# Patient Record
Sex: Female | Born: 1995 | Race: White | Hispanic: No | Marital: Married | Smoking: Never smoker
Health system: Southern US, Community
[De-identification: ages and names within clinical notes are randomized; demographics above are authoritative.]

## PROBLEM LIST (undated history)

## (undated) DIAGNOSIS — Z87440 Personal history of urinary (tract) infections: Secondary | ICD-10-CM

## (undated) DIAGNOSIS — R112 Nausea with vomiting, unspecified: Secondary | ICD-10-CM

## (undated) DIAGNOSIS — F988 Other specified behavioral and emotional disorders with onset usually occurring in childhood and adolescence: Secondary | ICD-10-CM

## (undated) DIAGNOSIS — Z9889 Other specified postprocedural states: Secondary | ICD-10-CM

## (undated) DIAGNOSIS — E785 Hyperlipidemia, unspecified: Secondary | ICD-10-CM

## (undated) DIAGNOSIS — F913 Oppositional defiant disorder: Secondary | ICD-10-CM

## (undated) DIAGNOSIS — F329 Major depressive disorder, single episode, unspecified: Secondary | ICD-10-CM

## (undated) DIAGNOSIS — F32A Depression, unspecified: Secondary | ICD-10-CM

## (undated) DIAGNOSIS — Z8489 Family history of other specified conditions: Secondary | ICD-10-CM

## (undated) DIAGNOSIS — N189 Chronic kidney disease, unspecified: Secondary | ICD-10-CM

## (undated) DIAGNOSIS — R059 Cough, unspecified: Secondary | ICD-10-CM

## (undated) DIAGNOSIS — Z7289 Other problems related to lifestyle: Secondary | ICD-10-CM

## (undated) DIAGNOSIS — T4145XA Adverse effect of unspecified anesthetic, initial encounter: Secondary | ICD-10-CM

## (undated) DIAGNOSIS — R05 Cough: Secondary | ICD-10-CM

## (undated) DIAGNOSIS — T8859XA Other complications of anesthesia, initial encounter: Secondary | ICD-10-CM

## (undated) HISTORY — DX: Depression, unspecified: F32.A

## (undated) HISTORY — DX: Chronic kidney disease, unspecified: N18.9

## (undated) HISTORY — DX: Oppositional defiant disorder: F91.3

## (undated) HISTORY — PX: WISDOM TOOTH EXTRACTION: SHX21

## (undated) HISTORY — DX: Major depressive disorder, single episode, unspecified: F32.9

## (undated) HISTORY — DX: Personal history of urinary (tract) infections: Z87.440

## (undated) HISTORY — DX: Other specified behavioral and emotional disorders with onset usually occurring in childhood and adolescence: F98.8

## (undated) HISTORY — DX: Hyperlipidemia, unspecified: E78.5

---
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
## 2017-01-11 ENCOUNTER — Ambulatory Visit: Payer: 59 | Admitting: Primary Care

## 2017-01-14 ENCOUNTER — Encounter: Payer: Self-pay | Admitting: Primary Care

## 2017-01-14 ENCOUNTER — Ambulatory Visit (INDEPENDENT_AMBULATORY_CARE_PROVIDER_SITE_OTHER): Payer: 59 | Admitting: Primary Care

## 2017-01-14 VITALS — BP 112/70 | HR 60 | Temp 98.1°F | Ht 63.0 in | Wt 129.8 lb

## 2017-01-14 DIAGNOSIS — R1905 Periumbilic swelling, mass or lump: Secondary | ICD-10-CM | POA: Diagnosis not present

## 2017-01-14 NOTE — Progress Notes (Signed)
   Subjective:    Patient ID: Taylor CrankerKaylee Kress, female    DOB: [birth year], 21 y.o.   MRN: [id number]  HPI  Ms. Frutoso SchatzKress is a 21 year old female who presents today with a chief complaint abdominal mass.  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 Over the past 5 years she's noticed growth of these two masses and over the past 2 years she's noticed discomfort. The masses are located proximal to her naval. Her pain feels "sharp" that is intermittently lasting minutes to one hour. She denies changes skin texture/color, trauma.  Review of Systems  Constitutional: Negative for fever.  Gastrointestinal: Negative for abdominal pain, constipation, diarrhea and nausea.  Skin: Negative for color change.       Abdominal skin masses       Past Medical History:  Diagnosis Date  . ADD (attention deficit disorder)   . Depression   . History of UTI   . Oppositional defiant disorder   . Self mutilating behavior      Social History   Social History  . Marital status: Single    Spouse name: N/A  . Number of children: N/A  . Years of education: N/A   Occupational History  . Student     8th grade [employer]   Social History Main Topics  . Smoking status: Never Smoker  . Smokeless tobacco: Never Used  . Alcohol use No  . Drug use: No     Comment: THC every other month  . Sexual activity: No   Other Topics Concern  . 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   Enjoys welding, theater, exercising.    Past Surgical History:  Procedure Laterality Date  . KIDNEY SURGERY  2004   Reflux  . WISDOM TOOTH EXTRACTION      Family History  Problem Relation Age of Onset  . Adopted: Yes    No Known Allergies  No current outpatient prescriptions on file prior to visit.   No current facility-administered medications on file prior to visit.     BP 112/70   Pulse 60   Temp 98.1 F (36.7 C) (Oral)    Ht 5\' 3"  (1.6 m)   Wt 129 lb 12.8 oz (58.9 kg)   LMP 01/14/2017   SpO2 99%   BMI 22.99 kg/m    Objective:   Physical Exam  Constitutional: She appears well-nourished.  Cardiovascular: Normal rate.   Pulmonary/Chest: Effort normal.  Abdominal:    0.5 cm circular, non tender, soft superficial-deep mass to top of naval. Smaller circular, non tender, soft, deeper mass proximal to naval. No erythema.   Skin: Skin is warm and dry.          Assessment & Plan:  Skin Mass:  Two masses noted. Exam today consistent for benign mass such as lipoma/cyst. Given increase growth coupled with discomfort, will order ultrasound for further evaluation. Low suspicion for hernia. No GI symptoms. Reassurance provided today.  Morrie Sheldonlark,Dakin Madani Kendal, NP

## 2017-01-14 NOTE — Patient Instructions (Signed)
Stop by the front desk and speak with either Shirlee LimerickMarion or Zella BallRobin regarding your ultrasound. I will be in touch with you once I receive the results.  It was a pleasure to see you today!

## 2017-01-18 ENCOUNTER — Ambulatory Visit
Admission: RE | Admit: 2017-01-18 | Discharge: 2017-01-18 | Disposition: A | Discharge status: Home / Self Care | Payer: 59 | Source: Ambulatory Visit | Attending: Primary Care | Admitting: Primary Care

## 2017-01-18 ENCOUNTER — Telehealth: Payer: Self-pay

## 2017-01-18 DIAGNOSIS — R1905 Periumbilic swelling, mass or lump: Secondary | ICD-10-CM | POA: Insufficient documentation

## 2017-01-18 DIAGNOSIS — K439 Ventral hernia without obstruction or gangrene: Secondary | ICD-10-CM

## 2017-01-18 NOTE — Telephone Encounter (Signed)
Pt was asking if there was a time frame that the hernias would have to be repaired. I explained that it was up to her when she wanted to do anything about them. I suggested she let us know when she needed a referral. The surgeon wold be able to tell her how long the recovery would be.

## 2017-02-18 NOTE — Telephone Encounter (Signed)
tvm

## 2017-02-18 NOTE — Addendum Note (Signed)
Addended by: Doreene Nest on: 02/18/2017 05:46 PM   Modules accepted: Orders

## 2017-02-18 NOTE — Telephone Encounter (Signed)
Pt left v/m requesting a contact # for surgeon in [place]. Pt request cb. Does pt need referral for surgeon.

## 2017-02-18 NOTE — Telephone Encounter (Signed)
Please notify patient that I placed a referral for a general surgical consult. Someone will be in touch with her soon to discuss location.

## 2017-02-19 NOTE — Telephone Encounter (Signed)
Spoken and notified patient of Kate's comments. Patient verbalized understanding. 

## 2017-02-20 ENCOUNTER — Encounter: Payer: Self-pay | Admitting: *Deleted

## 2017-02-20 ENCOUNTER — Telehealth: Payer: Self-pay | Admitting: Primary Care

## 2017-02-20 NOTE — Telephone Encounter (Signed)
Left message asking pt to call office referring to gen surgery referral

## 2017-03-04 ENCOUNTER — Ambulatory Visit: Payer: Self-pay | Admitting: General Surgery

## 2017-03-11 ENCOUNTER — Encounter: Payer: Self-pay | Admitting: *Deleted

## 2017-03-14 ENCOUNTER — Encounter: Payer: Self-pay | Admitting: General Surgery

## 2017-03-14 ENCOUNTER — Ambulatory Visit (INDEPENDENT_AMBULATORY_CARE_PROVIDER_SITE_OTHER): Payer: 59 | Admitting: General Surgery

## 2017-03-14 VITALS — BP 128/78 | HR 78 | Resp 12 | Ht 63.0 in | Wt 127.0 lb

## 2017-03-14 DIAGNOSIS — K439 Ventral hernia without obstruction or gangrene: Secondary | ICD-10-CM

## 2017-03-14 NOTE — Patient Instructions (Addendum)
Follow up appointment to be announced.  

## 2017-03-14 NOTE — Progress Notes (Signed)
Patient ID: Taylor Miranda, female   DOB: 1996-04-02, 21 y.o.   MRN: [id number]  Chief Complaint  Patient presents with  . Other    HPI Taylor Miranda is a 21 y.o. female here today for a evaluation of ventral hernia. Patient states she noticed this area above the umbilicus about three years ago. Patient states she has pain with activity. The area has got bigger in the last two years. Mom states she was born with an hernia, but this never significantly changed in size. The patient participates in mixed martial arts.  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  Mom, Taylor Miranda is present.   HPI  Past Medical History:  Diagnosis Date  . ADD (attention deficit disorder)   . Depression   . History of UTI   . Oppositional defiant disorder   . Self mutilating behavior     Past Surgical History:  Procedure Laterality Date  . KIDNEY SURGERY  2004   Reflux  . WISDOM TOOTH EXTRACTION      Family History  Problem Relation Age of Onset  . Adopted: Yes    Social History Social History  Substance Use Topics  . Smoking status: Never Smoker  . Smokeless tobacco: Never Used  . Alcohol use No    No Known Allergies  Current Outpatient Prescriptions  Medication Sig Dispense Refill  . amoxicillin (AMOXIL) 500 MG capsule TAKE 1 BY MOUTH TWICE A DAY WITH MEALS  0  . XULANE 150-35 MCG/24HR transdermal patch PLACE 1 PATCH ONTO THE SKIN ONCE A WEEK.  3   No current facility-administered medications for this visit.     Review of Systems Review of Systems  Constitutional: Negative.   Respiratory: Negative.   Cardiovascular: Negative.     Blood pressure 128/78, pulse 78, resp. rate 12, height  (1.6 m), weight 127 lb (57.6 kg), last menstrual period 03/06/2017.  Physical Exam Physical Exam  Constitutional: She is oriented to person, place, and time. She appears well-developed and well-nourished.  Eyes: Conjunctivae are normal. No  scleral icterus.  Cardiovascular: Normal rate, regular rhythm and normal heart sounds.   Pulmonary/Chest: Effort normal and breath sounds normal.  Abdominal: Soft. Normal appearance and bowel sounds are normal. There is no tenderness.    Epigastric  hernia 2 cm above the umbilicus and the other one is 7 cm above the umbilicus.   Lymphadenopathy:    She has no cervical adenopathy.  Neurological: She is alert and oriented to person, place, and time.  Skin: Skin is warm and dry.      Data Reviewed Abdominal ultrasound dated 01/18/2017: IMPRESSION: Imaging features most suggestive of midline ventral hernias containing herniated fat. Review of the previous CT scan from 2011 reveals the presence of an apparent small fat containing ventral hernia about 7 cm cranial to the umbilicus. Although no definite bowel is seen within either lesion on today's study, if surgical repair is planned, repeat CT may prove helpful to ensure that there is no bowel within either apparent hernia.  Assessment    Epigastric hernias 2.    Plan    Due to her symptoms, elective repair is reasonable. With her thin body habitus and the likelihood that this is pre-peritoneal fat without a intraperitoneal component, I think direct repair rather than laparoscopic approach would be appropriate. She is aware that she'll have likely 2 small scars.  We need to work around her work schedule, and  at present plans are for her to have her repair completed prior just as she begins are normal one-week off and then take an additional week to recuperate.  The patient reports that her coworkers to allow her to lift more than 40 pounds.     Hernia precautions and incarceration were discussed with the patient. If they develop symptoms of an incarcerated hernia, they were encouraged to seek prompt medical attention.  I don't anticipate any role for prosthetic mesh based on today's exam or review of her ultrasound.   [name] 03/15/2017, 6:50 AM  Patient wishes to have surgery done in January 2019. She will be contacted once the January schedule is available to arrange a date. Day of surgery instructions were provided to the patient and reviewed today.   Nicholes Mango, CMA

## 2017-03-15 DIAGNOSIS — K439 Ventral hernia without obstruction or gangrene: Secondary | ICD-10-CM | POA: Insufficient documentation

## 2017-03-20 ENCOUNTER — Telehealth: Payer: Self-pay | Admitting: *Deleted

## 2017-03-20 NOTE — Telephone Encounter (Signed)
Spoke with the patient today and she was notified that we have the January 2019 schedule available and can arrange for epigastric hernia repair.   Patient wishes to check her schedule and call the office back to arrange.

## 2017-03-25 ENCOUNTER — Telehealth: Payer: Self-pay | Admitting: *Deleted

## 2017-03-25 NOTE — Telephone Encounter (Signed)
Patient's surgery has been [facility].

## 2017-04-30 ENCOUNTER — Telehealth: Payer: Self-pay

## 2017-04-30 NOTE — Telephone Encounter (Signed)
Patient called and would like to reschedule her surgery to a sooner date. The patient is [facility]. She will pre admit by phone. The patient is aware of date and instructions.  OR has been notified of change.

## 2017-05-21 ENCOUNTER — Other Ambulatory Visit: Payer: Self-pay | Admitting: General Surgery

## 2017-05-23 ENCOUNTER — Other Ambulatory Visit: Payer: Self-pay

## 2017-05-23 ENCOUNTER — Encounter
Admission: RE | Admit: 2017-05-23 | Discharge: 2017-05-23 | Disposition: A | Discharge status: Home / Self Care | Payer: 59 | Source: Ambulatory Visit | Attending: General Surgery | Admitting: General Surgery

## 2017-05-23 HISTORY — DX: Other specified postprocedural states: R11.2

## 2017-05-23 HISTORY — DX: Adverse effect of unspecified anesthetic, initial encounter: T41.45XA

## 2017-05-23 HISTORY — DX: Cough, unspecified: R05.9

## 2017-05-23 HISTORY — DX: Other complications of anesthesia, initial encounter: T88.59XA

## 2017-05-23 HISTORY — DX: Other specified postprocedural states: Z98.890

## 2017-05-23 HISTORY — DX: Cough: R05

## 2017-05-23 NOTE — Patient Instructions (Signed)
  Your procedure is scheduled on: 05-30-17 Report to Same Day Surgery 2nd floor medical mall [facility](Medical Mall Entrance-take elevator on left to 2nd floor.  Check in with surgery information desk.) To find out your arrival time please call [phone] between 1PM - 3PM on 05-29-17  Remember: Instructions that are not followed completely may result in serious medical risk, up to and including death, or upon the discretion of your surgeon and anesthesiologist your surgery may need to be rescheduled.    _x___ 1. Do not eat food after midnight the night before your procedure. You may drink clear liquids up to 2 hours before you are scheduled to arrive at the hospital for your procedure.  Do not drink clear liquids within 2 hours of your scheduled arrival to the hospital.  Clear liquids include  --Water or Apple juice without pulp  --Clear carbohydrate beverage such as ClearFast or Gatorade  --Black Coffee or Clear Tea (No milk, no creamers, do not add anything to the coffee or Tea   No gum chewing or hard candies.     __x__ 2. No Alcohol for 24 hours before or after surgery.   __x__3. No Smoking for 24 prior to surgery.   ____  4. Bring all medications with you on the day of surgery if instructed.    __x__ 5. Notify your doctor if there is any change in your medical condition     (cold, fever, infections).     Do not wear jewelry, make-up, hairpins, clips or nail polish.  Do not wear lotions, powders, or perfumes. You may wear deodorant.  Do not shave 48 hours prior to surgery. Men may shave face and neck.  Do not bring valuables to the hospital.    [facility] any belongings or valuables.               Contacts, dentures or bridgework may not be worn into surgery.  Leave your suitcase in the car. After surgery it may be brought to your room.  For patients admitted to the hospital, discharge time is determined by your  treatment team.   Patients discharged the day  of surgery will not be allowed to drive home.  You will need someone to drive you home and stay with you the night of your procedure.  ____ Take anti-hypertensive listed below, cardiac, seizure, asthma, anti-reflux and psychiatric medicines. These include:  1. NONE  2.  3.  4.  5.  6.  ____Fleets enema or Magnesium Citrate as directed.   ____ Use CHG Soap or sage wipes as directed on instruction sheet   ____ Use inhalers on the day of surgery and bring to hospital day of surgery  ____ Stop Metformin and Janumet 2 days prior to surgery.    ____ Take 1/2 of usual insulin dose the night before surgery and none on the morning surgery.   ____ Follow recommendations from Cardiologist, Pulmonologist or PCP regarding stopping Aspirin, Coumadin, Plavix ,Eliquis, Effient, or Pradaxa, and Pletal.  ____Stop Anti-inflammatories such as Advil, Aleve, Ibuprofen, Motrin, Naproxen, Naprosyn, Goodies powders or aspirin products. OK to take Tylenol    ____ Stop supplements until after surgery.    ____ Bring C-Pap to the hospital.

## 2017-05-27 ENCOUNTER — Encounter: Payer: Self-pay | Admitting: *Deleted

## 2017-05-30 ENCOUNTER — Ambulatory Visit
Admission: RE | Admit: 2017-05-30 | Discharge: 2017-05-30 | Disposition: A | Discharge status: Home / Self Care | Payer: Commercial Managed Care - HMO | Source: Ambulatory Visit | Attending: General Surgery | Admitting: General Surgery

## 2017-05-30 ENCOUNTER — Encounter: Payer: Self-pay | Admitting: *Deleted

## 2017-05-30 ENCOUNTER — Ambulatory Visit: Payer: Commercial Managed Care - HMO | Admitting: Registered Nurse

## 2017-05-30 ENCOUNTER — Encounter
Admission: RE | Disposition: A | Discharge status: Home / Self Care | Payer: Self-pay | Source: Ambulatory Visit | Attending: General Surgery

## 2017-05-30 DIAGNOSIS — K439 Ventral hernia without obstruction or gangrene: Secondary | ICD-10-CM | POA: Diagnosis not present

## 2017-05-30 DIAGNOSIS — K430 Incisional hernia with obstruction, without gangrene: Secondary | ICD-10-CM | POA: Diagnosis not present

## 2017-05-30 DIAGNOSIS — Z79899 Other long term (current) drug therapy: Secondary | ICD-10-CM | POA: Diagnosis not present

## 2017-05-30 HISTORY — DX: Family history of other specified conditions: Z84.89

## 2017-05-30 HISTORY — PX: EPIGASTRIC HERNIA REPAIR: SHX404

## 2017-05-30 LAB — URINE DRUG SCREEN, QUALITATIVE (ARMC ONLY)
AMPHETAMINES, UR SCREEN: NOT DETECTED
BARBITURATES, UR SCREEN: NOT DETECTED
BENZODIAZEPINE, UR SCRN: NOT DETECTED
COCAINE METABOLITE, UR ~~LOC~~: NOT DETECTED
Cannabinoid 50 Ng, Ur ~~LOC~~: NOT DETECTED
MDMA (Ecstasy)Ur Screen: NOT DETECTED
METHADONE SCREEN, URINE: NOT DETECTED
OPIATE, UR SCREEN: NOT DETECTED
Phencyclidine (PCP) Ur S: NOT DETECTED
TRICYCLIC, UR SCREEN: NOT DETECTED

## 2017-05-30 LAB — POCT PREGNANCY, URINE: Preg Test, Ur: NEGATIVE

## 2017-05-30 SURGERY — REPAIR, HERNIA, EPIGASTRIC, ADULT
Anesthesia: General | Wound class: Clean

## 2017-05-30 MED ORDER — FENTANYL CITRATE (PF) 100 MCG/2ML IJ SOLN
INTRAMUSCULAR | Status: AC
  Filled 2017-05-30: qty 2

## 2017-05-30 MED ORDER — ARTIFICIAL TEARS OPHTHALMIC OINT
TOPICAL_OINTMENT | OPHTHALMIC | Status: AC
  Filled 2017-05-30: qty 3.5

## 2017-05-30 MED ORDER — HYDROCODONE-ACETAMINOPHEN 5-325 MG PO TABS: 1.0000 | ORAL_TABLET | ORAL | 0 refills | Status: DC | PRN

## 2017-05-30 MED ORDER — ONDANSETRON HCL 4 MG/2ML IJ SOLN
INTRAMUSCULAR | Status: AC
  Filled 2017-05-30: qty 2

## 2017-05-30 MED ORDER — HYDROCODONE-ACETAMINOPHEN 5-325 MG PO TABS
1.0000 | ORAL_TABLET | Freq: Once | ORAL | Status: AC
  Administered 2017-05-30: 1 via ORAL

## 2017-05-30 MED ORDER — DEXMEDETOMIDINE HCL IN NACL 200 MCG/50ML IV SOLN
INTRAVENOUS | Status: DC | PRN
  Administered 2017-05-30: 8 ug via INTRAVENOUS

## 2017-05-30 MED ORDER — FENTANYL CITRATE (PF) 100 MCG/2ML IJ SOLN
25.0000 ug | INTRAMUSCULAR | Status: DC | PRN
  Administered 2017-05-30: 25 ug via INTRAVENOUS

## 2017-05-30 MED ORDER — PROPOFOL 10 MG/ML IV BOLUS
INTRAVENOUS | Status: DC | PRN
  Administered 2017-05-30: 20 mg via INTRAVENOUS
  Administered 2017-05-30: 160 mg via INTRAVENOUS

## 2017-05-30 MED ORDER — MIDAZOLAM HCL 2 MG/2ML IJ SOLN
INTRAMUSCULAR | Status: AC
  Filled 2017-05-30: qty 2

## 2017-05-30 MED ORDER — OXYCODONE-ACETAMINOPHEN 5-325 MG PO TABS
ORAL_TABLET | ORAL | Status: AC
  Filled 2017-05-30: qty 1

## 2017-05-30 MED ORDER — KETOROLAC TROMETHAMINE 30 MG/ML IJ SOLN
INTRAMUSCULAR | Status: DC | PRN
  Administered 2017-05-30: 30 mg via INTRAVENOUS

## 2017-05-30 MED ORDER — FAMOTIDINE 20 MG PO TABS
ORAL_TABLET | ORAL | Status: AC
  Filled 2017-05-30: qty 1

## 2017-05-30 MED ORDER — DEXAMETHASONE SODIUM PHOSPHATE 10 MG/ML IJ SOLN
INTRAMUSCULAR | Status: AC
  Filled 2017-05-30: qty 1

## 2017-05-30 MED ORDER — LACTATED RINGERS IV SOLN
INTRAVENOUS | Status: DC
  Administered 2017-05-30: 08:00:00 via INTRAVENOUS

## 2017-05-30 MED ORDER — KETOROLAC TROMETHAMINE 30 MG/ML IJ SOLN
INTRAMUSCULAR | Status: AC
  Filled 2017-05-30: qty 1

## 2017-05-30 MED ORDER — MIDAZOLAM HCL 2 MG/2ML IJ SOLN
INTRAMUSCULAR | Status: DC | PRN
  Administered 2017-05-30: 2 mg via INTRAVENOUS

## 2017-05-30 MED ORDER — HYDROCODONE-ACETAMINOPHEN 5-325 MG PO TABS
ORAL_TABLET | ORAL | Status: AC
  Filled 2017-05-30: qty 1

## 2017-05-30 MED ORDER — FAMOTIDINE 20 MG PO TABS
20.0000 mg | ORAL_TABLET | Freq: Once | ORAL | Status: AC
  Administered 2017-05-30: 20 mg via ORAL

## 2017-05-30 MED ORDER — BUPIVACAINE-EPINEPHRINE 0.5% -1:200000 IJ SOLN
INTRAMUSCULAR | Status: DC | PRN
  Administered 2017-05-30: 30 mL

## 2017-05-30 MED ORDER — LIDOCAINE HCL (PF) 2 % IJ SOLN
INTRAMUSCULAR | Status: AC
  Filled 2017-05-30: qty 10

## 2017-05-30 MED ORDER — ONDANSETRON HCL 4 MG/2ML IJ SOLN
INTRAMUSCULAR | Status: DC | PRN
  Administered 2017-05-30: 4 mg via INTRAVENOUS

## 2017-05-30 MED ORDER — PROMETHAZINE HCL 25 MG/ML IJ SOLN: 6.2500 mg | INTRAMUSCULAR | Status: DC | PRN

## 2017-05-30 MED ORDER — DEXAMETHASONE SODIUM PHOSPHATE 10 MG/ML IJ SOLN
INTRAMUSCULAR | Status: DC | PRN
  Administered 2017-05-30: 5 mg via INTRAVENOUS

## 2017-05-30 MED ORDER — PROPOFOL 500 MG/50ML IV EMUL
INTRAVENOUS | Status: AC
Start: 2017-05-30 — End: ?
  Filled 2017-05-30: qty 50

## 2017-05-30 MED ORDER — ACETAMINOPHEN 10 MG/ML IV SOLN
INTRAVENOUS | Status: AC
  Filled 2017-05-30: qty 100

## 2017-05-30 MED ORDER — BUPIVACAINE-EPINEPHRINE (PF) 0.5% -1:200000 IJ SOLN
INTRAMUSCULAR | Status: AC
  Filled 2017-05-30: qty 30

## 2017-05-30 MED ORDER — ACETAMINOPHEN 10 MG/ML IV SOLN
INTRAVENOUS | Status: DC | PRN
  Administered 2017-05-30: 1000 mg via INTRAVENOUS

## 2017-05-30 MED ORDER — FENTANYL CITRATE (PF) 100 MCG/2ML IJ SOLN
INTRAMUSCULAR | Status: DC | PRN
  Administered 2017-05-30 (×4): 25 ug via INTRAVENOUS

## 2017-05-30 SURGICAL SUPPLY — 32 items
BLADE SURG 15 STRL SS SAFETY (BLADE) ×3 IMPLANT
CANISTER SUCT 1200ML W/VALVE (MISCELLANEOUS) ×3 IMPLANT
CHLORAPREP W/TINT 26ML (MISCELLANEOUS) ×3 IMPLANT
CLOSURE WOUND 1/2 X4 (GAUZE/BANDAGES/DRESSINGS) ×1
DRAPE LAPAROTOMY 100X77 ABD (DRAPES) ×3 IMPLANT
DRSG TEGADERM 4X4.75 (GAUZE/BANDAGES/DRESSINGS) ×3 IMPLANT
DRSG TELFA 4X3 1S NADH ST (GAUZE/BANDAGES/DRESSINGS) ×3 IMPLANT
ELECT REM PT RETURN 9FT ADLT (ELECTROSURGICAL) ×3
ELECTRODE REM PT RTRN 9FT ADLT (ELECTROSURGICAL) ×1 IMPLANT
GLOVE BIO SURGEON STRL SZ7.5 (GLOVE) ×3 IMPLANT
GLOVE INDICATOR 8.0 STRL GRN (GLOVE) ×3 IMPLANT
GOWN STRL REUS W/ TWL LRG LVL3 (GOWN DISPOSABLE) ×2 IMPLANT
GOWN STRL REUS W/TWL LRG LVL3 (GOWN DISPOSABLE) ×4
LABEL OR SOLS (LABEL) ×3 IMPLANT
NEEDLE HYPO 22GX1.5 SAFETY (NEEDLE) ×6 IMPLANT
NEEDLE HYPO 25X1 1.5 SAFETY (NEEDLE) ×3 IMPLANT
NS IRRIG 500ML POUR BTL (IV SOLUTION) ×3 IMPLANT
PACK BASIN MINOR ARMC (MISCELLANEOUS) ×3 IMPLANT
SPONGE LAP 18X18 5 PK (GAUZE/BANDAGES/DRESSINGS) ×3 IMPLANT
STAPLER SKIN PROX 35W (STAPLE) ×3 IMPLANT
STRIP CLOSURE SKIN 1/2X4 (GAUZE/BANDAGES/DRESSINGS) ×2 IMPLANT
SUT SURGILON 0 BLK (SUTURE) ×3 IMPLANT
SUT VIC AB 2-0 BRD 54 (SUTURE) ×3 IMPLANT
SUT VIC AB 2-0 CT1 27 (SUTURE) ×2
SUT VIC AB 2-0 CT1 TAPERPNT 27 (SUTURE) ×1 IMPLANT
SUT VIC AB 3-0 SH 27 (SUTURE) ×2
SUT VIC AB 3-0 SH 27X BRD (SUTURE) ×1 IMPLANT
SUT VIC AB 4-0 FS2 27 (SUTURE) ×3 IMPLANT
SUT VICRYL+ 3-0 144IN (SUTURE) ×3 IMPLANT
SWABSTK COMLB BENZOIN TINCTURE (MISCELLANEOUS) ×3 IMPLANT
SYR 3ML LL SCALE MARK (SYRINGE) ×3 IMPLANT
SYR CONTROL 10ML (SYRINGE) ×3 IMPLANT

## 2017-05-30 NOTE — Anesthesia Postprocedure Evaluation (Signed)
Anesthesia Post Note  Patient: Cecil CrankerKaylee Kress  Procedure(s) Performed: HERNIA REPAIR EPIGASTRIC ADULT x2 (N/A )  Patient location during evaluation: PACU Anesthesia Type: General Level of consciousness: awake and alert Pain management: pain level controlled Vital Signs Assessment: post-procedure vital signs reviewed and stable Respiratory status: spontaneous breathing, nonlabored ventilation, respiratory function stable and patient connected to nasal cannula oxygen Cardiovascular status: blood pressure returned to baseline and stable Postop Assessment: no apparent nausea or vomiting Anesthetic complications: no     Last Vitals:  Vitals:   05/30/17 1156 05/30/17 1209  BP: 99/63 106/67  Pulse: 69 62  Resp: 18 16  Temp: 36.9 C 36.9 C  SpO2: 99% 100%    Last Pain:  Vitals:   05/30/17 1209  TempSrc: Temporal  PainSc: 6                  Lenard SimmerAndrew Bethanny Toelle

## 2017-05-30 NOTE — Anesthesia Preprocedure Evaluation (Signed)
Anesthesia Evaluation  Patient identified by MRN, date of birth, ID band Patient awake    Reviewed: Allergy & Precautions, H&P , NPO status , Patient's Chart, lab work & pertinent test results, reviewed documented beta blocker date and time   History of Anesthesia Complications (+) PONV and history of anesthetic complications  Airway Mallampati: III  TM Distance: >3 FB Neck ROM: full    Dental  (+) Dental Advidsory Given, Teeth Intact   Pulmonary neg pulmonary ROS,           Cardiovascular Exercise Tolerance: Good negative cardio ROS       Neuro/Psych PSYCHIATRIC DISORDERS negative neurological ROS     GI/Hepatic negative GI ROS, Neg liver ROS,   Endo/Other  negative endocrine ROS  Renal/GU negative Renal ROS  negative genitourinary   Musculoskeletal   Abdominal   Peds  Hematology negative hematology ROS (+)   Anesthesia Other Findings Past Medical History: No date: ADD (attention deficit disorder) No date: Complication of anesthesia No date: Cough     Comment:  PT STATES SHE HAS BEEN HAVING A BAD COUGH X 1.5 YEARS               DURING THE WINTERTIME MOSTLY THAT HER PCP CANNOT FIGURE               OUT WHY-CURRENTLY PT DOES NOT HAVE THIS COUGH AS OF               05-23-17 No date: Depression No date: Family history of adverse reaction to anesthesia     Comment:  pt is adopted and unsure of family history  No date: History of UTI No date: Oppositional defiant disorder No date: PONV (postoperative nausea and vomiting)     Comment:  PT STATES SHE VOMITS STARTING THE DAY AFTER SURGERY No date: Self mutilating behavior   Reproductive/Obstetrics negative OB ROS                             Anesthesia Physical Anesthesia Plan  ASA: II  Anesthesia Plan: General   Post-op Pain Management:    Induction: Intravenous  PONV Risk Score and Plan: 4 or greater and Ondansetron,  Dexamethasone and Midazolam  Airway Management Planned: LMA and Oral ETT  Additional Equipment:   Intra-op Plan:   Post-operative Plan: Extubation in OR  Informed Consent: I have reviewed the patients History and Physical, chart, labs and discussed the procedure including the risks, benefits and alternatives for the proposed anesthesia with the patient or authorized representative who has indicated his/her understanding and acceptance.   Dental Advisory Given  Plan Discussed with: Anesthesiologist, CRNA and Surgeon  Anesthesia Plan Comments:         Anesthesia Quick Evaluation

## 2017-05-30 NOTE — Anesthesia Procedure Notes (Signed)
Procedure Name: LMA Insertion Date/Time: 05/30/2017 9:26 AM Performed by: Karoline CaldwellStarr, Demaree Liberto, CRNA Pre-anesthesia Checklist: Patient identified, Patient being monitored, Timeout performed, Emergency Drugs available and Suction available Patient Re-evaluated:Patient Re-evaluated prior to induction Oxygen Delivery Method: Circle system utilized Preoxygenation: Pre-oxygenation with 100% oxygen Induction Type: IV induction Ventilation: Mask ventilation without difficulty LMA: LMA inserted LMA Size: 3.5 Tube type: Oral Number of attempts: 1 Placement Confirmation: positive ETCO2 and breath sounds checked- equal and bilateral Tube secured with: Tape Dental Injury: Teeth and Oropharynx as per pre-operative assessment

## 2017-05-30 NOTE — Transfer of Care (Signed)
Immediate Anesthesia Transfer of Care Note  Patient: Taylor Miranda  Procedure(s) Performed: HERNIA REPAIR EPIGASTRIC ADULT x2 (N/A )  Patient Location: PACU  Anesthesia Type:General  Level of Consciousness: awake, alert  and oriented  Airway & Oxygen Therapy: Patient Spontanous Breathing and Patient connected to nasal cannula oxygen  Post-op Assessment: Report given to RN and Post -op Vital signs reviewed and stable  Post vital signs: Reviewed and stable  Last Vitals:  Vitals:   05/30/17 0826 05/30/17 1011  BP: 119/67 (!) 93/50  Pulse: 86 75  Resp: 16 12  Temp: 37.1 C 36.4 C  SpO2: 100% 100%    Last Pain:  Vitals:   05/30/17 0826  TempSrc: Oral  PainSc: 0-No pain         Complications: No apparent anesthesia complications

## 2017-05-30 NOTE — Discharge Instructions (Signed)

## 2017-05-30 NOTE — H&P (Signed)
Cecil CrankerKaylee Kress [id number] 02-Jan-1996     HPI: Healthy 21 y/o with two ventral defects above the umbilicus. For repair.   Medications Prior to Admission  Medication Sig Dispense Refill Last Dose  . levocetirizine (XYZAL) 5 MG tablet Take 5 mg by mouth daily.  0 05/23/2017  . XULANE 150-35 MCG/24HR transdermal patch PLACE 1 PATCH ONTO THE SKIN ONCE A WEEK ON SUNDAY  3 Past Week at Unknown time   No Known Allergies Past Medical History:  Diagnosis Date  . ADD (attention deficit disorder)   . Complication of anesthesia   . Cough    PT STATES SHE HAS BEEN HAVING A BAD COUGH X 1.5 YEARS DURING THE WINTERTIME MOSTLY THAT HER PCP CANNOT FIGURE OUT WHY-CURRENTLY PT DOES NOT HAVE THIS COUGH AS OF 05-23-17  . Depression   . Family history of adverse reaction to anesthesia    pt is adopted and unsure of family history   . History of UTI   . Oppositional defiant disorder   . PONV (postoperative nausea and vomiting)    PT STATES SHE VOMITS STARTING THE DAY AFTER SURGERY  . Self mutilating behavior    Past Surgical History:  Procedure Laterality Date  . KIDNEY SURGERY  2004   Reflux  . WISDOM TOOTH EXTRACTION     Social History   Socioeconomic History  . Marital status: Single    Spouse name: Not on file  . Number of children: Not on file  . Years of education: Not on file  . Highest education level: Not on file  Social Needs  . Financial resource strain: Not on file  . Food insecurity - worry: Not on file  . Food insecurity - inability: Not on file  . Transportation needs - medical: Not on file  . Transportation needs - non-medical: Not on file  Occupational History  . 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 Smoking status: Never Smoker  . Smokeless tobacco: Never Used  Substance and Sexual Activity  . Alcohol use: No  . Drug use: No    Comment: THC every other month-PT DENIES ANY DRUG USE DURING 05-23-17 PHONE INTERVIEW  . Sexual activity:  No  Other Topics Concern  . 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   Enjoys welding, theater, exercising.   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   Enjoys welding, theater, exercising.     ROS: Negative.     PE: HEENT: Negative. Lungs: Clear. Cardio: RR. Abd: Soft, non-tender. Hernia sites marked.  Assessment/Plan:  Proceed with planned ventral hernia repair x 2.   Earline MayotteByrnett, Kalani Baray W 05/30/2017

## 2017-05-30 NOTE — Anesthesia Post-op Follow-up Note (Signed)
Anesthesia QCDR form completed.        

## 2017-05-30 NOTE — Op Note (Signed)
Preoperative diagnosis: Epigastric hernia x2.  Postoperative diagnosis: Same.  Operative procedure: Repair of epigastric hernia x2.  Operating Surgeon: Donnalee CurryJeffrey Aniceto Kyser, MD.  Anesthesia: General by LMA, Marcaine 0.5% with 1-200,000 units of epinephrine, 30 cc.  Estimated blood loss: Less than 5 cc.  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  She is developed a second bulge more superior in the epigastrium.  She is admitted for elective hernia repair.  Operative note: With the patient under adequate general anesthesia and the surgical sites being marked prior to the anesthesia the abdomen was cleansed with ChloraPrep and draped.  Marcaine was infiltrated for postoperative analgesia.  The superior lesion was treated first.  A small transverse incision was made with hemostasis achieved by electrocautery.  The protruding preperitoneal fat was transected with a 3-0 Vicryl tie for hemostasis.  The 6 mm fascial opening was approximated with 0 Surgilon suture x2.  Attention was turned to the area above the umbilicus.  Again a small, transverse incision was made and hemostasis achieved with electrocautery.  The defect here was about 1 cm in diameter.  The preperitoneal fat was transected as noted above.  The fascial defect was closed with 3, 0 Surgilon sutures.  The adipose tissue at both sites was approximated with 3-0 Vicryl figure-of-eight sutures.  The skin was closed with a running 4-0 Vicryl subcuticular suture.  Benzoin, Steri-Strips, Telfa and Tegaderm dressings were applied.  The patient tolerated the procedure well and was brought to the recovery room in stable condition.

## 2017-05-31 ENCOUNTER — Encounter: Payer: Self-pay | Admitting: General Surgery

## 2017-06-06 ENCOUNTER — Ambulatory Visit (INDEPENDENT_AMBULATORY_CARE_PROVIDER_SITE_OTHER): Payer: Managed Care, Other (non HMO) | Admitting: General Surgery

## 2017-06-06 ENCOUNTER — Encounter: Payer: Self-pay | Admitting: General Surgery

## 2017-06-06 VITALS — BP 110/76 | HR 102 | Resp 12 | Ht 63.0 in | Wt 128.0 lb

## 2017-06-06 DIAGNOSIS — K439 Ventral hernia without obstruction or gangrene: Secondary | ICD-10-CM

## 2017-06-06 NOTE — Patient Instructions (Addendum)
The patient is aware to call back for any questions or new concerns. Proper lifting techniques reviewed. Resume activities as tolerated.  

## 2017-06-06 NOTE — Progress Notes (Signed)
Patient ID: Taylor Miranda, female   DOB: May 26, 1996, 22 y.o.   MRN: [id number]  Chief Complaint  Patient presents with  . Routine Post Op    HPI Taylor Miranda is a 22 y.o. female here today for her post op  epigastric repair done on 05/30/2017. Patient states she is getting along well, better each day. She is here with her mother, Taylor Miranda.   HPI  Past Medical History:  Diagnosis Date  . ADD (attention deficit disorder)   . Complication of anesthesia   . Cough    PT STATES SHE HAS BEEN HAVING A BAD COUGH X 1.5 YEARS DURING THE WINTERTIME MOSTLY THAT HER PCP CANNOT FIGURE OUT WHY-CURRENTLY PT DOES NOT HAVE THIS COUGH AS OF 05-23-17  . Depression   . Family history of adverse reaction to anesthesia    pt is adopted and unsure of family history   . History of UTI   . Oppositional defiant disorder   . PONV (postoperative nausea and vomiting)    PT STATES SHE VOMITS STARTING THE DAY AFTER SURGERY  . Self mutilating behavior     Past Surgical History:  Procedure Laterality Date  . EPIGASTRIC HERNIA REPAIR N/A 05/30/2017   Procedure: HERNIA REPAIR EPIGASTRIC ADULT x2;  Surgeon: [name], MD;  Location: [facility] ORS;  Service: General;  Laterality: N/A;  . KIDNEY SURGERY  2004   Reflux  . WISDOM TOOTH EXTRACTION      Family History  Adopted: Yes    Social History Social History   Tobacco Use  . Smoking status: Never Smoker  . Smokeless tobacco: Never Used  Substance Use Topics  . Alcohol use: No  . Drug use: No    Comment: THC every other month-PT DENIES ANY DRUG USE DURING 05-23-17 PHONE INTERVIEW    No Known Allergies  Current Outpatient Medications  Medication Sig Dispense Refill  . levocetirizine (XYZAL) 5 MG tablet Take 5 mg by mouth daily.  0  . Naproxen Sodium (ALEVE PO) Take by mouth as needed.    [employer] 150-35 MCG/24HR transdermal patch PLACE 1 PATCH ONTO THE SKIN ONCE A WEEK ON SUNDAY  3   No current facility-administered medications for this  visit.     Review of Systems Review of Systems  Constitutional: Negative.   Respiratory: Negative.   Cardiovascular: Negative.   Gastrointestinal: Positive for constipation. Negative for diarrhea.    Blood pressure 110/76, pulse (!) 102, resp. rate 12, height 5\' 3"  (1.6 m), weight 128 lb (58.1 kg), last menstrual period 05/30/2017, SpO2 99 %.  Physical Exam Physical Exam  Constitutional: She is oriented to person, place, and time. She appears well-developed and well-nourished.  Abdominal: Soft. There is no tenderness.    Incision clean  Neurological: She is alert and oriented to person, place, and time.  Skin: Skin is warm and dry.  Psychiatric: Her behavior is normal.       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    Plan         Follow up as needed. Proper lifting techniques reviewed. Resume activities as tolerated. The patient is aware to call back for any questions or new concerns.   HPI, Physical Exam, Assessment and Plan have been scribed under the direction and in the presence of [name], MD. Dorathy Daft, RN  I have completed the exam and reviewed the above documentation for accuracy and completeness.  I agree with the above.  Museum/gallery [employer] has been used and any errors in dictation or transcription are unintentional.  Taylor Miranda, M.D., F.A.C.S.  Earline MayotteByrnett, Taylor Miranda 06/06/2017, 12:06 PM

## 2017-06-21 ENCOUNTER — Other Ambulatory Visit: Payer: 59

## 2017-07-11 ENCOUNTER — Encounter: Payer: Self-pay | Admitting: General Surgery

## 2017-07-11 ENCOUNTER — Ambulatory Visit (INDEPENDENT_AMBULATORY_CARE_PROVIDER_SITE_OTHER): Payer: Managed Care, Other (non HMO) | Admitting: General Surgery

## 2017-07-11 VITALS — BP 120/70 | HR 80 | Resp 12 | Ht 64.0 in | Wt 131.0 lb

## 2017-07-11 DIAGNOSIS — K439 Ventral hernia without obstruction or gangrene: Secondary | ICD-10-CM

## 2017-07-11 NOTE — Patient Instructions (Addendum)
Patient to return as needed. Proper lifting techniques reviewed.Massage the area .The patient is aware to call back for any questions or new concerns.

## 2017-07-11 NOTE — Progress Notes (Signed)
Patient ID: Taylor Miranda, female   DOB: 12-08-1995, 22 y.o.   MRN: [id number]  Chief Complaint  Patient presents with  . Abdominal Pain    HPI Taylor Miranda is a 22 y.o. female here today for a evaluation of abdominal pain. The pain his located right above her belly button and last about a day. This happens about every other day.Pain with activity.  No vomiting or nausea. Moves her bowels daily.  Epigastric hernia repair done on 05/30/2017.   Mother, Taylor Miranda is present at visit.  HPI  Past Medical History:  Diagnosis Date  . ADD (attention deficit disorder)   . Complication of anesthesia   . Cough    PT STATES SHE HAS BEEN HAVING A BAD COUGH X 1.5 YEARS DURING THE WINTERTIME MOSTLY THAT HER PCP CANNOT FIGURE OUT WHY-CURRENTLY PT DOES NOT HAVE THIS COUGH AS OF 05-23-17  . Depression   . Family history of adverse reaction to anesthesia    pt is adopted and unsure of family history   . History of UTI   . Oppositional defiant disorder   . PONV (postoperative nausea and vomiting)    PT STATES SHE VOMITS STARTING THE DAY AFTER SURGERY  . Self mutilating behavior     Past Surgical History:  Procedure Laterality Date  . EPIGASTRIC HERNIA REPAIR N/A 05/30/2017   Procedure: HERNIA REPAIR EPIGASTRIC ADULT x2;  Surgeon: [name], MD;  Location: [facility] ORS;  Service: General;  Laterality: N/A;  . KIDNEY SURGERY  2004   Reflux  . WISDOM TOOTH EXTRACTION      Family History  Adopted: Yes    Social History Social History   Tobacco Use  . Smoking status: Never Smoker  . Smokeless tobacco: Never Used  Substance Use Topics  . Alcohol use: No  . Drug use: No    Comment: THC every other month-PT DENIES ANY DRUG USE DURING 05-23-17 PHONE INTERVIEW    No Known Allergies  Current Outpatient Medications  Medication Sig Dispense Refill  . XULANE 150-35 MCG/24HR transdermal patch PLACE 1 PATCH ONTO THE SKIN ONCE A WEEK ON SUNDAY  3   No current facility-administered medications  for this visit.     Review of Systems Review of Systems  Constitutional: Negative.   Respiratory: Negative.   Cardiovascular: Negative.     Blood pressure 120/70, pulse 80, resp. rate 12, height 5\' 4"  (1.626 m), weight 131 lb (59.4 kg).  Physical Exam Physical Exam  Constitutional: She is oriented to person, place, and time. She appears well-developed and well-nourished.  Abdominal: Soft. Normal appearance. There is no hepatomegaly. No hernia.    Neurological: She is alert and oriented to person, place, and time.  Skin: Skin is warm and dry.    Data Reviewed Primary repair of both small fascial defects.  Assessment    Soft tissue soreness without evidence of recurrent hernia formation.    Plan  Patient to return as needed. Proper lifting techniques reviewed.The patient is aware to call back for any questions or new concerns.  Reassured that the healing ridge palpable is unremarkable.   HPI, Physical Exam, Assessment and Plan have been scribed under the direction and in the presence of Donnalee Curry, MD.  Ples Specter, CMA  I have completed the exam and reviewed the above documentation for accuracy and completeness.  I agree with the above.  Museum/gallery conservator has been used and any errors in dictation or transcription are unintentional.  Donnalee Curry, M.D., F.A.C.S.  [name] 07/12/2017, 3:05 PM

## 2018-02-11 ENCOUNTER — Telehealth: Payer: Self-pay

## 2018-02-11 NOTE — Telephone Encounter (Signed)
Please advise 

## 2018-02-11 NOTE — Telephone Encounter (Signed)
Copied from CRM [id number]. Topic: General - Other >> Feb 10, 2018 12:20 PM Trula Slade wrote: Reason for CRM:   Patient is requesting a physical on a Friday after 12:30pm, because she works out of town and don't know when she will be in town.  Please advise. Any other day of the week let her know and she will have her boss with her when she schedules it.  Please call [phone].  Spoke with patient. Due to traveling out of town for her work she wanted to see if she could be worked in this week or next week for [employer] for a CPE. Friday preferable but if not she will take another day. She is not sure when she will be in town after next week. Please review if possible to do this and let patient know. Thank Neta Mends, RMA

## 2018-02-11 NOTE — Telephone Encounter (Signed)
Okay to add her on this Friday in the 3:40 block. Thanks!

## 2018-02-12 NOTE — Telephone Encounter (Signed)
Can you please call and schedule patient?-Jayveon Convey V Jeanna Giuffre, RMA

## 2018-02-14 ENCOUNTER — Ambulatory Visit (INDEPENDENT_AMBULATORY_CARE_PROVIDER_SITE_OTHER): Payer: Managed Care, Other (non HMO) | Admitting: Primary Care

## 2018-02-14 ENCOUNTER — Encounter: Payer: Self-pay | Admitting: Primary Care

## 2018-02-14 VITALS — BP 114/68 | HR 99 | Temp 98.1°F | Ht 63.5 in | Wt 137.0 lb

## 2018-02-14 DIAGNOSIS — G8929 Other chronic pain: Secondary | ICD-10-CM | POA: Diagnosis not present

## 2018-02-14 DIAGNOSIS — R05 Cough: Secondary | ICD-10-CM | POA: Diagnosis not present

## 2018-02-14 DIAGNOSIS — F331 Major depressive disorder, recurrent, moderate: Secondary | ICD-10-CM | POA: Diagnosis not present

## 2018-02-14 DIAGNOSIS — R053 Chronic cough: Secondary | ICD-10-CM

## 2018-02-14 DIAGNOSIS — R079 Chest pain, unspecified: Secondary | ICD-10-CM | POA: Diagnosis not present

## 2018-02-14 DIAGNOSIS — Z0001 Encounter for general adult medical examination with abnormal findings: Secondary | ICD-10-CM

## 2018-02-14 DIAGNOSIS — Z0000 Encounter for general adult medical examination without abnormal findings: Secondary | ICD-10-CM

## 2018-02-14 NOTE — Assessment & Plan Note (Signed)
Intermittent for four years. Prior cardiology and pulmonary work up negative per patient. ECG today with NSR with rate of 73. T-wave inversion to V2. No ST elevation, PAC/PVC.  Check labs today including TSH, CBC, CMP.  Consider cardiology evaluation once labs return.

## 2018-02-14 NOTE — Patient Instructions (Signed)
Stop by the lab prior to leaving today. I will notify you of your results once received.   Continue exercising. You should be getting 150 minutes of moderate intensity exercise weekly.  Remember to eat a healthy diet with plenty of vegetables, fruit, whole grains, lean protein.  Ensure you are consuming 64 ounces of water daily.  It was a pleasure to see you today!   Preventive Care 18-39 Years, Female Preventive care refers to lifestyle choices and visits with your health care provider that can promote health and wellness. What does preventive care include?  A yearly physical exam. This is also called an annual well check.  Dental exams once or twice a year.  Routine eye exams. Ask your health care provider how often you should have your eyes checked.  Personal lifestyle choices, including: ? Daily care of your teeth and gums. ? Regular physical activity. ? Eating a healthy diet. ? Avoiding tobacco and drug use. ? Limiting alcohol use. ? Practicing safe sex. ? Taking vitamin and mineral supplements as recommended by your health care provider. What happens during an annual well check? The services and screenings done by your health care provider during your annual well check will depend on your age, overall health, lifestyle risk factors, and family history of disease. Counseling Your health care provider may ask you questions about your:  Alcohol use.  Tobacco use.  Drug use.  Emotional well-being.  Home and relationship well-being.  Sexual activity.  Eating habits.  Work and work Statistician.  Method of birth control.  Menstrual cycle.  Pregnancy history.  Screening You may have the following tests or measurements:  Height, weight, and BMI.  Diabetes screening. This is done by checking your blood sugar (glucose) after you have not eaten for a while (fasting).  Blood pressure.  Lipid and cholesterol levels. These may be checked every 5 years starting  at age 78.  Skin check.  Hepatitis C blood test.  Hepatitis B blood test.  Sexually transmitted disease (STD) testing.  BRCA-related cancer screening. This may be done if you have a family history of breast, ovarian, tubal, or peritoneal cancers.  Pelvic exam and Pap test. This may be done every 3 years starting at age 14. Starting at age 101, this may be done every 5 years if you have a Pap test in combination with an HPV test.  Discuss your test results, treatment options, and if necessary, the need for more tests with your health care provider. Vaccines Your health care provider may recommend certain vaccines, such as:  Influenza vaccine. This is recommended every year.  Tetanus, diphtheria, and acellular pertussis (Tdap, Td) vaccine. You may need a Td booster every 10 years.  Varicella vaccine. You may need this if you have not been vaccinated.  HPV vaccine. If you are 30 or younger, you may need three doses over 6 months.  Measles, mumps, and rubella (MMR) vaccine. You may need at least one dose of MMR. You may also need a second dose.  Pneumococcal 13-valent conjugate (PCV13) vaccine. You may need this if you have certain conditions and were not previously vaccinated.  Pneumococcal polysaccharide (PPSV23) vaccine. You may need one or two doses if you smoke cigarettes or if you have certain conditions.  Meningococcal vaccine. One dose is recommended if you are age 46-21 years and a first-year college student living in a residence hall, or if you have one of several medical conditions. You may also need additional booster doses.  Hepatitis A vaccine. You may need this if you have certain conditions or if you travel or work in places where you may be exposed to hepatitis A.  Hepatitis B vaccine. You may need this if you have certain conditions or if you travel or work in places where you may be exposed to hepatitis B.  Haemophilus influenzae type b (Hib) vaccine. You may need  this if you have certain risk factors.  Talk to your health care provider about which screenings and vaccines you need and how often you need them. This information is not intended to replace advice given to you by your health care provider. Make sure you discuss any questions you have with your health care provider. Document Released: 07/17/2001 Document Revised: 02/08/2016 Document Reviewed: 03/22/2015 Elsevier Interactive Patient Education  [employer].

## 2018-02-14 NOTE — Assessment & Plan Note (Signed)
Present for the last four years, pulmonology work up at that time negative.  No wheezing on exam. Didn't get to discuss this in great detail but will recommend Zantac/Pepcid for potential silent GERD.

## 2018-02-14 NOTE — Assessment & Plan Note (Signed)
Overall doing well on her own without medication. Discussed to notify me if symptoms progress. She denies SI/HI.

## 2018-02-14 NOTE — Assessment & Plan Note (Signed)
Td UTD, due in 2020. Declines influenza vaccination. Pap smear due next year. Recommended to work on diet by eating fresh prepared meals, limit frozen dinners.  Exam unremarkable. Labs pending. Follow up in year for CPE.

## 2018-02-14 NOTE — Progress Notes (Signed)
Subjective:    Patient ID: Taylor Miranda, female    DOB: May 18, 1996, 22 y.o.   MRN: [id number]  HPI  Taylor Miranda is a 22 year old female who presents today for complete physical. She also reports chronic left sided chest pain and chronic cough.   Left sided chest pain with radiation down to her left upper extremity and up to left jaw. Also with intermittent daily cough. This has been intermittent for four years. She underwent evaluation with a cardiologist and pulmonologist four years ago, work up was negative per patient. Her symptoms were attributed to "growing pains". She tested negative for asthma.   Now she's experiencing chest pain 2-3 times weekly and will occur with both rest and exertion. She describes her pain as sharp which will last several minutes. She has palpitations that occur twice monthly that will last for one day. She denies family history of heart disease in mother, thinks her maternal grandfather had heart disease.   Immunizations: -Tetanus: Completed in 2010 -Influenza: Declines   Diet: She endorses a fair diet.  Breakfast: Cereal, biscuit  Lunch: Fruit, toast, mac and cheese Dinner: Frozen dinners, sometimes fast food Snacks: Chips Desserts: Twice monthly  Beverages: Water, Gatorade, diet soda, juice  Exercise: She is not exercising, active at work Eye exam: No recent exam Dental exam: Completes annually    Review of Systems  Constitutional: Negative for fever and unexpected weight change.  HENT: Negative for rhinorrhea.   Respiratory: Positive for cough. Negative for shortness of breath.   Cardiovascular: Positive for chest pain.  Gastrointestinal: Negative for constipation and diarrhea.  Genitourinary: Negative for difficulty urinating and menstrual problem.  Musculoskeletal:       Generalized joint aches daily. Active at work.  Skin: Negative for rash.  Allergic/Immunologic: Negative for environmental allergies.  Neurological: Negative for dizziness,  numbness and headaches.  Psychiatric/Behavioral: Negative for suicidal ideas.       Intermittent symptoms of depression, overall handles well on her own.       Past Medical History:  Diagnosis Date  . ADD (attention deficit disorder)   . Complication of anesthesia   . Cough    PT STATES SHE HAS BEEN HAVING A BAD COUGH X 1.5 YEARS DURING THE WINTERTIME MOSTLY THAT HER PCP CANNOT FIGURE OUT WHY-CURRENTLY PT DOES NOT HAVE THIS COUGH AS OF 05-23-17  . Depression   . Family history of adverse reaction to anesthesia    pt is adopted and unsure of family history   . History of UTI   . Oppositional defiant disorder   . PONV (postoperative nausea and vomiting)    PT STATES SHE VOMITS STARTING THE DAY AFTER SURGERY  . Self mutilating behavior      Social History   Socioeconomic History  . Marital status: Single    Spouse name: Not on file  . Number of children: Not on file  . Years of education: Not on file  . Highest education level: Not on file  Occupational History  . Occupation: Ship broker    Comment: 8th grade [place]  . Financial resource strain: Not on file  . Food insecurity:    Worry: Not on file    Inability: Not on file  . Transportation needs:    Medical: Not on file    Non-medical: Not on file  Tobacco Use  . Smoking status: Never Smoker  . Smokeless tobacco: Never Used  Substance and Sexual Activity  . Alcohol use:  No  . Drug use: No    Types: Marijuana    Comment: THC every other month-PT DENIES ANY DRUG USE DURING 05-23-17 PHONE INTERVIEW  . Sexual activity: Never  Lifestyle  . Physical activity:    Days per week: Not on file    Minutes per session: Not on file  . Stress: Not on file  Relationships  . Social connections:    Talks on phone: Not on file    Gets together: Not on file    Attends religious service: Not on file    Active member of club or organization: Not on file    Attends meetings of clubs or organizations: Not on  file    Relationship status: Not on file  . Intimate partner violence:    Fear of current or ex partner: Not on file    Emotionally abused: Not on file    Physically abused: Not on file    Forced sexual activity: Not on file  Other Topics Concern  . 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   Enjoys welding, theater, exercising.    Past Surgical History:  Procedure Laterality Date  . EPIGASTRIC HERNIA REPAIR N/A 05/30/2017   Procedure: HERNIA REPAIR EPIGASTRIC ADULT x2;  Surgeon: Robert Bellow, MD;  Location: [facility] ORS;  Service: General;  Laterality: N/A;  . KIDNEY SURGERY  2004   Reflux  . WISDOM TOOTH EXTRACTION      Family History  Adopted: Yes    No Known Allergies  Current Outpatient Medications on File Prior to Visit  Medication Sig Dispense Refill  . XULANE 150-35 MCG/24HR transdermal patch PLACE 1 PATCH ONTO THE SKIN ONCE A WEEK ON SUNDAY  3   No current facility-administered medications on file prior to visit.     BP 114/68 (BP Location: Left Arm, Patient Position: Sitting, Cuff Size: Normal)   Pulse 99   Temp 98.1 F (36.7 C) (Oral)   Ht 5' 3.5" (1.613 m)   Wt 137 lb (62.1 kg)   LMP 01/27/2018 (Approximate)   SpO2 98%   BMI 23.89 kg/m    Objective:   Physical Exam  Constitutional: She is oriented to person, place, and time. She appears well-nourished.  HENT:  Mouth/Throat: No oropharyngeal exudate.  Eyes: Pupils are equal, round, and reactive to light. EOM are normal.  Neck: Neck supple. No thyromegaly present.  Cardiovascular: Normal rate and regular rhythm.  Respiratory: Effort normal and breath sounds normal.  GI: Soft. Bowel sounds are normal. There is no tenderness.  Musculoskeletal: Normal range of motion.  Neurological: She is alert and oriented to person, place, and time.  Skin: Skin is warm and dry.  Psychiatric: She has a normal mood and affect.           Assessment & Plan:

## 2018-02-15 LAB — COMPREHENSIVE METABOLIC PANEL
AG RATIO: 1.4 (calc) (ref 1.0–2.5)
ALT: 13 U/L (ref 6–29)
AST: 16 U/L (ref 10–30)
Albumin: 3.9 g/dL (ref 3.6–5.1)
Alkaline phosphatase (APISO): 38 U/L (ref 33–115)
BILIRUBIN TOTAL: 0.3 mg/dL (ref 0.2–1.2)
BUN: 12 mg/dL (ref 7–25)
CALCIUM: 9.4 mg/dL (ref 8.6–10.2)
CO2: 21 mmol/L (ref 20–32)
Chloride: 105 mmol/L (ref 98–110)
Creat: 0.81 mg/dL (ref 0.50–1.10)
GLOBULIN: 2.7 g/dL (ref 1.9–3.7)
GLUCOSE: 82 mg/dL (ref 65–99)
Potassium: 4.1 mmol/L (ref 3.5–5.3)
SODIUM: 137 mmol/L (ref 135–146)
TOTAL PROTEIN: 6.6 g/dL (ref 6.1–8.1)

## 2018-02-15 LAB — LIPID PANEL
CHOLESTEROL: 214 mg/dL — AB (ref <200)
HDL: 66 mg/dL (ref >50)
LDL Cholesterol (Calc): 123 mg/dL (calc) — ABNORMAL HIGH
Non-HDL Cholesterol (Calc): 148 mg/dL (calc) — ABNORMAL HIGH (ref <130)
Total CHOL/HDL Ratio: 3.2 (calc) (ref <5.0)
Triglycerides: 134 mg/dL (ref <150)

## 2018-02-15 LAB — CBC
HCT: 40.7 % (ref 35.0–45.0)
HEMOGLOBIN: 14 g/dL (ref 11.7–15.5)
MCH: 31 pg (ref 27.0–33.0)
MCHC: 34.4 g/dL (ref 32.0–36.0)
MCV: 90.2 fL (ref 80.0–100.0)
MPV: 11.9 fL (ref 7.5–12.5)
Platelets: 265 10*3/uL (ref 140–400)
RBC: 4.51 10*6/uL (ref 3.80–5.10)
RDW: 13.1 % (ref 11.0–15.0)
WBC: 6.5 10*3/uL (ref 3.8–10.8)

## 2018-02-15 LAB — TSH: TSH: 2.92 mIU/L

## 2018-02-16 ENCOUNTER — Other Ambulatory Visit: Payer: Self-pay | Admitting: Primary Care

## 2018-02-16 DIAGNOSIS — R079 Chest pain, unspecified: Principal | ICD-10-CM

## 2018-02-16 DIAGNOSIS — G8929 Other chronic pain: Secondary | ICD-10-CM

## 2018-02-17 ENCOUNTER — Encounter: Payer: Self-pay | Admitting: *Deleted

## 2018-04-30 ENCOUNTER — Ambulatory Visit (INDEPENDENT_AMBULATORY_CARE_PROVIDER_SITE_OTHER): Payer: Managed Care, Other (non HMO)

## 2018-04-30 ENCOUNTER — Other Ambulatory Visit: Payer: Self-pay

## 2018-04-30 ENCOUNTER — Encounter: Payer: Self-pay | Admitting: Internal Medicine

## 2018-04-30 ENCOUNTER — Ambulatory Visit: Payer: Managed Care, Other (non HMO) | Admitting: Internal Medicine

## 2018-04-30 VITALS — BP 115/77 | HR 68 | Ht 63.0 in | Wt 137.8 lb

## 2018-04-30 DIAGNOSIS — G8929 Other chronic pain: Secondary | ICD-10-CM

## 2018-04-30 DIAGNOSIS — R079 Chest pain, unspecified: Secondary | ICD-10-CM | POA: Diagnosis not present

## 2018-04-30 LAB — ECHOCARDIOGRAM COMPLETE
HEIGHTINCHES: 63 in
Weight: 2204 oz

## 2018-04-30 NOTE — Patient Instructions (Addendum)
Medication Instructions:  Your physician recommends that you continue on your current medications as directed. Please refer to the Current Medication list given to you today.  If you need a refill on your cardiac medications before your next appointment, please call your pharmacy.   Lab work: none If you have labs (blood work) drawn today and your tests are completely normal, you will receive your results only by: Marland Kitchen. MyChart Message (if you have MyChart) OR . A paper copy in the mail If you have any lab test that is abnormal or we need to change your treatment, we will call you to review the results.  Testing/Procedures: Your physician has requested that you have an echocardiogram. Echocardiography is a painless test that uses sound waves to create images of your heart. It provides your doctor with information about the size and shape of your heart and how well your heart's chambers and valves are working. This procedure takes approximately one hour. There are no restrictions for this procedure. You may get an IV, if needed, to receive an ultrasound enhancing agent through to better visualize your heart.    Your physician has requested that you have an exercise tolerance test. For further information please visit [email or web address]. Please also follow instruction sheet, as given.   DO NOT drink or eat foods with caffeine for 24 hours before the test. (Chocolate, coffee, tea, decaf coffee/tea, or energy drinks)  DO NOT smoke for 4 hours before your test.  If you use an inhaler, bring it with you to the test.  Wear comfortable shoes and clothing. Women do not wear dresses.    Follow-Up: At [facility], you and your health needs are our priority.  As part of our continuing mission to provide you with exceptional heart care, we have created designated Provider Care Teams.  These Care Teams include your primary Cardiologist (physician) and Advanced Practice Providers (APPs -  Physician  Assistants and Nurse Practitioners) who all work together to provide you with the care you need, when you need it. You will need a follow up appointment in 6 weeks.  Please call our office 2 months in advance to schedule this appointment.  You may see DR Cristal DeerHRISTOPHER END or one of the following Advanced Practice Providers on your designated Care Team:   Nicolasa Duckinghristopher Berge, NP Eula Listenyan Dunn, PA-C . Marisue IvanJacquelyn Visser, PA-C    Exercise Stress Electrocardiogram An exercise stress electrocardiogram is a test to check how blood flows to your heart. It is done to find areas of poor blood flow. You will need to walk on a treadmill for this test. The electrocardiogram will record your heartbeat when you are at rest and when you are exercising. What happens before the procedure?  Do not have drinks with caffeine or foods with caffeine for 24 hours before the test, or as told by your doctor. This includes coffee, tea (even decaf tea), sodas, chocolate, and cocoa.  Follow your doctor's instructions about eating and drinking before the test.  Ask your doctor what medicines you should or should not take before the test. Take your medicines with water unless told by your doctor not to.  If you use an inhaler, bring it with you to the test.  Bring a snack to eat after the test.  Do not  smoke for 4 hours before the test.  Do not put lotions, powders, creams, or oils on your chest before the test.  Wear comfortable shoes and clothing. What happens during the procedure?  You will have patches put on your chest. Small areas of your chest may need to be shaved. Wires will be connected to the patches.  Your heart rate will be watched while you are resting and while you are exercising.  You will walk on the treadmill. The treadmill will slowly get faster to raise your heart rate.  The test will take about 1-2 hours. What happens after the procedure?  Your heart rate and blood pressure will be watched after  the test.  You may return to your normal diet, activities, and medicines or as told by your doctor. This information is not intended to replace advice given to you by your health care provider. Make sure you discuss any questions you have with your health care provider. Document Released: 11/07/2007 Document Revised: 01/18/2016 Document Reviewed: 01/26/2013 Elsevier Interactive Patient Education  2018 [employer].     Echocardiogram An echocardiogram, or echocardiography, uses sound waves (ultrasound) to produce an image of your heart. The echocardiogram is simple, painless, obtained within a short period of time, and offers valuable information to your health care provider. The images from an echocardiogram can provide information such as:  Evidence of coronary artery disease (CAD).  Heart size.  Heart muscle function.  Heart valve function.  Aneurysm detection.  Evidence of a past heart attack.  Fluid buildup around the heart.  Heart muscle thickening.  Assess heart valve function.  Tell a health care provider about:  Any allergies you have.  All medicines you are taking, including vitamins, herbs, eye drops, creams, and over-the-counter medicines.  Any problems you or family members have had with anesthetic medicines.  Any blood disorders you have.  Any surgeries you have had.  Any medical conditions you have.  Whether you are pregnant or may be pregnant. What happens before the procedure? No special preparation is needed. Eat and drink normally. What happens during the procedure?  In order to produce an image of your heart, gel will be applied to your chest and a wand-like tool (transducer) will be moved over your chest. The gel will help transmit the sound waves from the transducer. The sound waves will harmlessly bounce off your heart to allow the heart images to be captured in real-time motion. These images will then be recorded.  You may need an IV to  receive a medicine that improves the quality of the pictures. What happens after the procedure? You may return to your normal schedule including diet, activities, and medicines, unless your health care provider tells you otherwise. This information is not intended to replace advice given to you by your health care provider. Make sure you discuss any questions you have with your health care provider. Document Released: 05/18/2000 Document Revised: 01/07/2016 Document Reviewed: 01/26/2013 Elsevier Interactive Patient Education  2017 [employer].

## 2018-04-30 NOTE — Progress Notes (Signed)
New Outpatient Visit Date: 04/30/2018  Referring Provider: Doreene Nestlark, Katherine K, NP [address] [place], [place] [address]  Chief Complaint: Chest pain  HPI:  Taylor Miranda is a 22 y.o. female who is being seen today for the evaluation of chest pain at the request of Ms. Clark. She has a history of chronic chest pain that was evaluated by pediatric [facility]ology at [facility] as well as chronic cough, hyperlipidemia, and depression.  She was told that her symptoms were due to "growing pains."  Other than an EKG, it does not appear that further cardiac testing was performed.  Her mother reports that GI testing (including possible EGD) was recommended, though this was never pursued.  Taylor Miranda has continued to have intermittent episodes of chest pain.  Sometimes, it happens several days in a row.  However, she will sometimes also go up to a week without symptoms.  She reports that the pain occurs in the center and left side of her chest, often radiating to the neck/jaw and left arm.  There is associated shortness of breath.  She also notes occasional palpitations, though they are not always associated with the chest pain.  The pain is not exertional.  It typically lasts anywhere from 10-15 minutes to several hours.  Intensity ranges from 3 to 8/10.  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  She denies edema, orthopnea, PND, and lightheadedness.  Cough has gradually improved over the last few years, though is still occurs at times (independent of chest pain).  Taylor Miranda reports falling off a horse at least twice as a child, though she otherwise denies trauma.  She also suffered a splenic laceration after falling in 2011.  She does not believe that any of these traumas are related to her more recent chest pain.  --------------------------------------------------------------------------------------------------  Cardiovascular History & Procedures: Cardiovascular  Problems:  Chronic chest pain  Risk Factors:  None  Cath/PCI:  None  CV Surgery:  None  EP Procedures and Devices:  None  Non-Invasive Evaluation(s):  None  Recent CV Pertinent Labs: Lab Results  Component Value Date   CHOL 214 (H) 02/14/2018   HDL 66 02/14/2018   LDLCALC 123 (H) 02/14/2018   TRIG 134 02/14/2018   CHOLHDL 3.2 02/14/2018   K 4.1 02/14/2018   BUN 12 02/14/2018   CREATININE 0.81 02/14/2018    --------------------------------------------------------------------------------------------------  Past Medical History:  Diagnosis Date  . ADD (attention deficit disorder)   . Chronic kidney disease    Kidney Reflux   . Complication of anesthesia   . Cough    PT STATES SHE HAS BEEN HAVING A BAD COUGH X 1.5 YEARS DURING THE WINTERTIME MOSTLY THAT HER PCP CANNOT FIGURE OUT WHY-CURRENTLY PT DOES NOT HAVE THIS COUGH AS OF 05-23-17  . Depression   . Family history of adverse reaction to anesthesia    pt is adopted and unsure of family history   . History of UTI   . Hyperlipidemia   . Oppositional defiant disorder   . PONV (postoperative nausea and vomiting)    PT STATES SHE VOMITS STARTING THE DAY AFTER SURGERY  . Self mutilating behavior     Past Surgical History:  Procedure Laterality Date  . EPIGASTRIC HERNIA REPAIR N/A 05/30/2017   Procedure: HERNIA REPAIR EPIGASTRIC ADULT x2;  Surgeon: Earline MayotteByrnett, Jeffrey W, MD;  Location: [facility] ORS;  Service: General;  Laterality: N/A;  . KIDNEY SURGERY  2004   Reflux  .  WISDOM TOOTH EXTRACTION      Current Meds  Medication Sig  . XULANE 150-35 MCG/24HR transdermal patch PLACE 1 PATCH ONTO THE SKIN ONCE A WEEK ON SUNDAY    Allergies: Patient has no known allergies.  Social History   Tobacco Use  . Smoking status: Never Smoker  . Smokeless tobacco: Never Used  Substance Use Topics  . Alcohol use: No  . Drug use: Not Currently    Types: Marijuana    Comment: Chart indicates prior marijuana use     Family History  Adopted: Yes  Problem Relation Age of Onset  . Heart Problems Mother   . Heart attack Maternal Grandfather     Review of Systems: A 12-system review of systems was performed and was negative except as noted in the HPI.  --------------------------------------------------------------------------------------------------  Physical Exam: BP 115/77 (BP Location: Left Arm, Patient Position: Sitting, Cuff Size: Normal)   Pulse 68   Ht 5\' 3"  (1.6 m)   Wt 137 lb 12 oz (62.5 kg)   BMI 24.40 kg/m   General:  NAD.  Accompanied by her mother. HEENT: No conjunctival pallor or scleral icterus. Moist mucous membranes. OP clear. Neck: Supple without lymphadenopathy, thyromegaly, JVD, or HJR. No carotid bruit. Lungs: Normal work of breathing. Clear to auscultation bilaterally without wheezes or crackles. Heart: Regular rate and rhythm without murmurs, rubs, or gallops. Non-displaced PMI. Abd: Bowel sounds present. Soft, NT/ND without hepatosplenomegaly Ext: No lower extremity edema. Radial, PT, and DP pulses are 2+ bilaterally Skin: Warm and dry without rash. Neuro: CNIII-XII intact. Strength and fine-touch sensation intact in upper and lower extremities bilaterally. Psych: Normal mood and affect.  EKG:  NSR with early R-wave transition.  Lab Results  Component Value Date   WBC 6.5 02/14/2018   HGB 14.0 02/14/2018   HCT 40.7 02/14/2018   MCV 90.2 02/14/2018   PLT 265 02/14/2018    Lab Results  Component Value Date   NA 137 02/14/2018   K 4.1 02/14/2018   CL 105 02/14/2018   CO2 21 02/14/2018   BUN 12 02/14/2018   CREATININE 0.81 02/14/2018   GLUCOSE 82 02/14/2018   ALT 13 02/14/2018    Lab Results  Component Value Date   CHOL 214 (H) 02/14/2018   HDL 66 02/14/2018   LDLCALC 123 (H) 02/14/2018   TRIG 134 02/14/2018   CHOLHDL 3.2 02/14/2018      --------------------------------------------------------------------------------------------------  ASSESSMENT AND PLAN: Chest pain Atypical and present for many years.  Prior pediatric cardiology evaluation at [facility] suggested non-cardiac etiology.  I agree that MSK and GI causes are most likely.  However, given persistent nature and non-specific EKG abnormalities, we will proceed with a transthoracic echocardiogram.  Hopefully, we will be able to visualize the coronary ostia to exclude an anomalous coronary artery.  If not we may need to consider exercise tolerance testing and/or cardiac CTA/MRA.  In the meantime, I have recommended empiric treatment with naproxen 250 mg BID for at least a week to see if this helps her symptoms.  Chronic non-productive cough also raises the possibility of an aortic arch anomaly, though this is quite rare.  CTA/MRA would allow for evaluation of this needed.  Follow-up: Return to clinic in ~6 weeks.  Yvonne Kendall, MD 04/30/2018 8:28 PM

## 2018-05-15 ENCOUNTER — Ambulatory Visit (INDEPENDENT_AMBULATORY_CARE_PROVIDER_SITE_OTHER): Payer: Managed Care, Other (non HMO)

## 2018-05-15 DIAGNOSIS — R079 Chest pain, unspecified: Secondary | ICD-10-CM | POA: Diagnosis not present

## 2018-05-15 DIAGNOSIS — G8929 Other chronic pain: Secondary | ICD-10-CM | POA: Diagnosis not present

## 2018-05-16 LAB — EXERCISE TOLERANCE TEST
CHL CUP MPHR: 200 {beats}/min
CSEPEW: 12.7 METS
Exercise duration (min): 10 min
Exercise duration (sec): 39 s
Peak HR: 190 {beats}/min
Percent HR: 95 %
RPE: 13
Rest HR: 86 {beats}/min

## 2018-06-19 NOTE — Progress Notes (Signed)
Cardiology Office Note Date:  06/20/2018  Patient ID:  Taylor Miranda, DOB June 04, 1996, MRN [id number] PCP:  Doreene Nestlark, Katherine K, NP  Cardiologist:  Dr. Okey DupreEnd, MD    Chief Complaint: Follow up  History of Present Illness: Taylor Miranda is a 23 y.o. female with history of chronic chest pain, chronic cough, HLD, and depression who presents for follow up of chest pain and palpitations.  Patient was previously followed by pediatric [facility]ology at [facility] with symptoms being attributed to growing pains. It appeared she did not have workup beyond EKG at that time. GI testing was recommended, though not pursued. She reported a history of falling off a hoarse at least twice as a child as well as a splenic laceration in 2011 secondary to a fall. She was seen by Dr. Okey DupreEnd as a new patient on 04/30/2018 with continued intermittent episodes of chest pain. Sometimes, the pain would last several days in a row. She reported being able to go a week without symptoms at times. The pain was located in the center and left side of her chest, often radiated to the neck/jaw and left arm. There was associated SOB and at time palpitations, though at times her palpitations are not associated with her chest pain. The pain was not noted to be exertional and would last from 10-15 minutes to several hours. Pain ranged from a 3 to 8/10. 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 EKG at her visit on 04/30/2018 showed sinus rhythm with early R wave transition. Her pain was felt to be most likely MSK or GI in etiology. Echo was obtained on 04/30/2018 and showed an EF of 55-60%, no RWMA, normal LV diastolic function, RV cavity size was normal with normal RVSF. She was started on empiric naproxen. GXT on 05/15/2018 showed good exercise capacity with normal heart rate and BP responses without angina being reported. No significant arrhythmia was noted. She exercised for 10  minutes and 39 seconds, achieving a maximum heart rate of 190 bpm and peak BP of 163/77. This was a low risk ETT.   She comes in today feeling about the same.  She continues to note intermittent sharp midline to left-sided chest pain that occasionally radiates to the neck, left-side of the jaw, and left arm.  At its worst, pain is rated an 8 out of 10.  Pain continues to not be directly associated with exertion, rest, or position.  At times there are associated palpitations and shortness of breath though not every time.  She continues to state the pain can occur for several days at a time, or been intermittent throughout a particular day or be completely asymptomatic for several days in a row.  She denies any lower extremity swelling, orthopnea, PND, abdominal distention, or early satiety.  No dizziness, presyncope, or syncope.  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  She indicates she works 15 days straight and has 6 days off.  She did not play sports as a kid though did theater with dance.  During her exertional times as a child she denies any dizziness, chest pain, presyncope, or syncope.  Patient reports that she is adopted though believes her mother has heart disease with prior MI though details are unclear.  She last had chest pain on 06/19/2018.  She denies any alcohol, tobacco, or illegal drug abuse.  She drinks one diet  soda every other day when she is not on her 15 days straight of work.  She denies drinking energy drinks.  She otherwise drinks water.  She is no longer taking the naproxen and did not note any symptom improvement with this.  She is currently asymptomatic.  Past Medical History:  Diagnosis Date  . ADD (attention deficit disorder)   . Chronic kidney disease    Kidney Reflux   . Complication of anesthesia   . Cough    PT STATES SHE HAS BEEN HAVING A BAD COUGH X 1.5 YEARS DURING THE WINTERTIME MOSTLY  THAT HER PCP CANNOT FIGURE OUT WHY-CURRENTLY PT DOES NOT HAVE THIS COUGH AS OF 05-23-17  . Depression   . Family history of adverse reaction to anesthesia    pt is adopted and unsure of family history   . History of UTI   . Hyperlipidemia   . Oppositional defiant disorder   . PONV (postoperative nausea and vomiting)    PT STATES SHE VOMITS STARTING THE DAY AFTER SURGERY  . Self mutilating behavior     Past Surgical History:  Procedure Laterality Date  . EPIGASTRIC HERNIA REPAIR N/A 05/30/2017   Procedure: HERNIA REPAIR EPIGASTRIC ADULT x2;  Surgeon: [name], MD;  Location: [facility] ORS;  Service: General;  Laterality: N/A;  . KIDNEY SURGERY  2004   Reflux  . WISDOM TOOTH EXTRACTION      Current Meds  Medication Sig  . XULANE 150-35 MCG/24HR transdermal patch PLACE 1 PATCH ONTO THE SKIN ONCE A WEEK ON SUNDAY    Allergies:   Patient has no known allergies.   Social History:  The patient  reports that she has never smoked. She has never used smokeless tobacco. She reports previous drug use. Drug: Marijuana. She reports that she does not drink alcohol.   Family History:  The patient's family history includes Heart Problems in her mother; Heart attack in her maternal grandfather. She was adopted.  ROS:   Review of Systems  Constitutional: Positive for malaise/fatigue. Negative for chills, diaphoresis, fever and weight loss.  HENT: Negative for congestion.   Eyes: Negative for discharge and redness.  Respiratory: Positive for shortness of breath. Negative for cough, hemoptysis, sputum production and wheezing.   Cardiovascular: Positive for chest pain and palpitations. Negative for orthopnea, claudication, leg swelling and PND.  Gastrointestinal: Negative for abdominal pain, blood in stool, heartburn, melena, nausea and vomiting.  Genitourinary: Negative for hematuria.  Musculoskeletal: Negative for falls and myalgias.  Skin: Negative for rash.  Neurological: Negative for  dizziness, tingling, tremors, sensory change, speech change, focal weakness, loss of consciousness and weakness.  Endo/Heme/Allergies: Does not bruise/bleed easily.  Psychiatric/Behavioral: Negative for substance abuse. The patient is not nervous/anxious.   All other systems reviewed and are negative.    PHYSICAL EXAM:  VS:  BP 92/62 (BP Location: Left Arm, Patient Position: Sitting, Cuff Size: Normal)   Pulse 66   Ht 5\' 3"  (1.6 m)   Wt 142 lb 12 oz (64.8 kg)   BMI 25.29 kg/m  BMI: Body mass index is 25.29 kg/m.  Physical Exam  Constitutional: She is oriented to person, place, and time. She appears well-developed and well-nourished.  HENT:  Head: Normocephalic and atraumatic.  Eyes: Right eye exhibits no discharge. Left eye exhibits no discharge.  Neck: Normal range of motion. No JVD present.  Cardiovascular: Normal rate, regular rhythm, S1 normal, S2 normal and normal heart sounds. Exam reveals no distant heart sounds, no friction rub,  no midsystolic click and no opening snap.  No murmur heard. Pulses:      Posterior tibial pulses are 2+ on the right side and 2+ on the left side.  Pulmonary/Chest: Effort normal and breath sounds normal. No respiratory distress. She has no decreased breath sounds. She has no wheezes. She has no rales. She exhibits no tenderness.  Abdominal: Soft. She exhibits no distension. There is no abdominal tenderness.  Musculoskeletal:        General: No edema.  Neurological: She is alert and oriented to person, place, and time.  Skin: Skin is warm and dry. No cyanosis. Nails show no clubbing.  Psychiatric: She has a normal mood and affect. Her speech is normal and behavior is normal. Judgment and thought content normal.     EKG:  Was ordered and interpreted by me today. Shows NSR, 66 bpm, normal axis, no acute st/t changes   Recent Labs: 02/14/2018: ALT 13; BUN 12; Creat 0.81; Hemoglobin 14.0; Platelets 265; Potassium 4.1; Sodium 137; TSH 2.92  02/14/2018:  Cholesterol 214; HDL 66; LDL Cholesterol (Calc) 123; Total CHOL/HDL Ratio 3.2; Triglycerides 134   CrCl cannot be calculated (Patient's most recent lab result is older than the maximum 21 days allowed.).   Wt Readings from Last 3 Encounters:  06/20/18 142 lb 12 oz (64.8 kg)  04/30/18 137 lb 12 oz (62.5 kg)  02/14/18 137 lb (62.1 kg)     Other studies reviewed: Additional studies/records reviewed today include: summarized above  ASSESSMENT AND PLAN:  1. Chest pain: Symptoms are fairly atypical.  Noninvasive evaluation thus far including echocardiogram and GXT have been unrevealing.  Given the patient's continued symptoms and including her chronic cough which she reports started around age 23 or [age] we will proceed with a cardiac CTA to evaluate for structural abnormalities of the coronary ostia and aortic arch.  Given her young age, lack of modifiable risk factors, preserved LV systolic function, and normal GXT I do not think her symptoms are ischemia in etiology.  Disposition: F/u with Dr. Okey DupreEnd or an APP in 6 weeks.  Current medicines are reviewed at length with the patient today.  The patient did not have any concerns regarding medicines.  Signed, Eula Listenyan Dunn, PA-C 06/20/2018 10:55 AM     [facility] [place] [address] [place], [place] [address] [phone]

## 2018-06-20 ENCOUNTER — Encounter: Payer: Self-pay | Admitting: Physician Assistant

## 2018-06-20 ENCOUNTER — Ambulatory Visit: Payer: Managed Care, Other (non HMO) | Admitting: Physician Assistant

## 2018-06-20 VITALS — BP 92/62 | HR 66 | Ht 63.0 in | Wt 142.8 lb

## 2018-06-20 DIAGNOSIS — G8929 Other chronic pain: Secondary | ICD-10-CM | POA: Diagnosis not present

## 2018-06-20 DIAGNOSIS — R079 Chest pain, unspecified: Secondary | ICD-10-CM | POA: Diagnosis not present

## 2018-06-20 MED ORDER — METOPROLOL TARTRATE 50 MG PO TABS: 50.0000 mg | ORAL_TABLET | Freq: Once | ORAL | 0 refills | Status: DC

## 2018-06-20 NOTE — Patient Instructions (Signed)
Medication Instructions:   Your physician recommends that you continue on your current medications as directed. Please refer to the Current Medication list given to you today.  If you need a refill on your cardiac medications before your next appointment, please call your pharmacy.   Lab work: Your physician recommends that you return for lab work in: within 30 days of scheduled CT   If you have labs (blood work) drawn today and your tests are completely normal, you will receive your results only by: Marland Kitchen MyChart Message (if you have MyChart) OR . A paper copy in the mail If you have any lab test that is abnormal or we need to change your treatment, we will call you to review the results.  Testing/Procedures: 1- Coronary CT Please arrive at the [facility] main entrance of [facility] at xx:xx AM (30-45 minutes prior to test start time)  [facility] [address] [place], [place] [address] [phone]  Proceed to the [facility] Radiology Department (First Floor).  Please follow these instructions carefully (unless otherwise directed):  On the Night Before the Test: . Be sure to Drink plenty of water. . Do not consume any caffeinated/decaffeinated beverages or chocolate 12 hours prior to your test. . Do not take any antihistamines 12 hours prior to your test.  On the Day of the Test: . Drink plenty of water. Do not drink any water within one hour of the test. . Do not eat any food 4 hours prior to the test. . You may take your regular medications prior to the test.  . Take metoprolol (Lopressor) two hours prior to test, sent to [facility] [address]      After the Test: . Drink plenty of water. . After receiving IV contrast, you may experience a mild flushed feeling. This is normal. . On occasion, you may experience a mild rash up to 24 hours after the test. This is not dangerous. If this occurs, you can take Benadryl 25 mg and increase your fluid intake. . If  you experience trouble breathing, this can be serious. If it is severe call 911 IMMEDIATELY. If it is mild, please call our office. . If you take any of these medications: Glipizide/Metformin, Avandament, Glucavance, please do not take 48 hours after completing test.   Follow-Up: At [facility], you and your health needs are our priority.  As part of our continuing mission to provide you with exceptional heart care, we have created designated Provider Care Teams.  These Care Teams include your primary Cardiologist (physician) and Advanced Practice Providers (APPs -  Physician Assistants and Nurse Practitioners) who all work together to provide you with the care you need, when you need it. You will need a follow up appointment in 6 weeks.  Please call our office 2 months in advance to schedule this appointment.  You may see Dr. Okey Dupre or one of the following Advanced Practice Providers on your designated Care Team:   Nicolasa Ducking, NP Eula Listen, PA-C . Marisue Ivan, PA-C

## 2018-06-25 ENCOUNTER — Ambulatory Visit: Payer: Managed Care, Other (non HMO) | Admitting: Physician Assistant

## 2018-07-09 ENCOUNTER — Encounter (HOSPITAL_COMMUNITY): Payer: Self-pay

## 2018-07-09 ENCOUNTER — Ambulatory Visit (HOSPITAL_COMMUNITY): Admission: RE | Admit: 2018-07-09 | Payer: Managed Care, Other (non HMO) | Source: Ambulatory Visit

## 2018-07-09 ENCOUNTER — Telehealth (HOSPITAL_COMMUNITY): Payer: Self-pay | Admitting: Emergency Medicine

## 2018-07-09 ENCOUNTER — Ambulatory Visit (HOSPITAL_COMMUNITY)
Admission: RE | Admit: 2018-07-09 | Discharge: 2018-07-09 | Disposition: A | Discharge status: Home / Self Care | Payer: Managed Care, Other (non HMO) | Source: Ambulatory Visit | Attending: Physician Assistant | Admitting: Physician Assistant

## 2018-07-09 DIAGNOSIS — G8929 Other chronic pain: Secondary | ICD-10-CM

## 2018-07-09 DIAGNOSIS — R079 Chest pain, unspecified: Secondary | ICD-10-CM

## 2018-07-09 MED ORDER — IOPAMIDOL (ISOVUE-370) INJECTION 76%
80.0000 mL | Freq: Once | INTRAVENOUS | Status: AC | PRN
  Administered 2018-07-09: 80 mL via INTRAVENOUS

## 2018-07-09 MED ORDER — NITROGLYCERIN 0.4 MG SL SUBL
0.8000 mg | SUBLINGUAL_TABLET | Freq: Once | SUBLINGUAL | Status: AC
  Administered 2018-07-09: 0.8 mg via SUBLINGUAL
  Filled 2018-07-09: qty 25

## 2018-07-09 MED ORDER — NITROGLYCERIN 0.4 MG SL SUBL
SUBLINGUAL_TABLET | SUBLINGUAL | Status: AC
  Administered 2018-07-09: 0.8 mg via SUBLINGUAL
  Filled 2018-07-09: qty 1

## 2018-07-09 NOTE — Telephone Encounter (Signed)
Reaching out to patient to offer assistance regarding upcoming cardiac imaging study; pt verbalizes understanding of appt date/time, parking situation and where to check in, pre-test NPO status and medications ordered, and verified current allergies; name and call back number provided for further questions should they arise Jameria Bradway RN Navigator Cardiac Imaging [facility] [phone] office [phone] cell 

## 2018-07-10 ENCOUNTER — Telehealth: Payer: Self-pay

## 2018-07-10 NOTE — Telephone Encounter (Signed)
-----   Message from Sondra Barges, [place] sent at 07/10/2018  1:09 PM EST ----- Cardiac CTA: No significant extracardiac findings within the thoracic cavity.  Coronary arteries showed no calcium with no anomalies and normal sized aortic root. No evidence of disease or structural abnormalities to account for her symptoms.  I will route to her primary cardiologist as well for an FYI.

## 2018-07-10 NOTE — Telephone Encounter (Signed)
Pt return ing call. She verbalized understanding of CT results. No further questions at this time.   No new orders at this time.   Advised pt to call for any further questions or concerns.

## 2018-07-10 NOTE — Telephone Encounter (Signed)
Attempted to call patient. LMTCB 2/6

## 2018-08-01 ENCOUNTER — Ambulatory Visit: Payer: Managed Care, Other (non HMO) | Admitting: Physician Assistant

## 2018-08-13 ENCOUNTER — Ambulatory Visit: Payer: Managed Care, Other (non HMO) | Admitting: Physician Assistant

## 2018-08-28 ENCOUNTER — Telehealth: Payer: Self-pay

## 2018-08-28 NOTE — Telephone Encounter (Signed)
TELEPHONE CALL NOTE  Taylor Miranda has been deemed a candidate for a follow-up tele-health visit to limit community exposure during the Covid-19 pandemic. I spoke with the patient via phone to ensure availability of phone/video source, confirm preferred email & phone number, and discuss instructions and expectations.  I reminded Taylor Miranda to be prepared with any vital sign and/or heart rhythm information that could potentially be obtained via home monitoring, at the time of her visit. I reminded Taylor Miranda to expect an e-mail containing a link for their video-based visit approximately 15 minutes before her visit, or alternatively, a phone call at the time of her visit if her visit is planned to be a phone encounter.  STAFF MUST READ CONSENT VERBATIM TO PATIENT BELOW - Did the patient verbally consent to treatment as below? YES  [name], [facility] 08/28/2018 10:17 AM   CONSENT FOR TELE-HEALTH VISIT - PLEASE REVIEW  I hereby voluntarily request, consent and authorize [facility] and its employed or contracted physicians, physician assistants, nurse practitioners or other licensed health care professionals (the Practitioner), to provide me with telemedicine health care services (the "Services") as deemed necessary by the treating Practitioner. I acknowledge and consent to receive the Services by the Practitioner via telemedicine. I understand that the telemedicine visit will involve communicating with the Practitioner through live audiovisual communication technology and the disclosure of certain medical information by electronic transmission. I acknowledge that I have been given the opportunity to request an in-person assessment or other available alternative prior to the telemedicine visit and am voluntarily participating in the telemedicine visit.  I understand that I have the right to withhold or withdraw my consent to the use of telemedicine in the course of my care at any time, without  affecting my right to future care or treatment, and that the Practitioner or I may terminate the telemedicine visit at any time. I understand that I have the right to inspect all information obtained and/or recorded in the course of the telemedicine visit and may receive copies of available information for a reasonable fee.  I understand that some of the potential risks of receiving the Services via telemedicine include:  Marland Kitchen Delay or interruption in medical evaluation due to technological equipment failure or disruption; . Information transmitted may not be sufficient (e.g. poor resolution of images) to allow for appropriate medical decision making by the Practitioner; and/or  . In rare instances, security protocols could fail, causing a breach of personal health information.  Furthermore, I acknowledge that it is my responsibility to provide information about my medical history, conditions and care that is complete and accurate to the best of my ability. I acknowledge that Practitioner's advice, recommendations, and/or decision may be based on factors not within their control, such as incomplete or inaccurate data provided by me or distortions of diagnostic images or specimens that may result from electronic transmissions. I understand that the practice of medicine is not an exact science and that Practitioner makes no warranties or guarantees regarding treatment outcomes. I acknowledge that I will receive a copy of this consent concurrently upon execution via email to the email address I last provided but may also request a printed copy by calling the office of [facility].    I understand that my insurance will be billed for this visit.   I have read or had this consent read to me. . I understand the contents of this consent, which adequately explains the benefits and risks of the Services  being provided via telemedicine.  . I have been provided ample opportunity to ask questions regarding this  consent and the Services and have had my questions answered to my satisfaction. . I give my informed consent for the services to be provided through the use of telemedicine in my medical care  By participating in this telemedicine visit I agree to the above.

## 2018-08-29 NOTE — Progress Notes (Signed)
Virtual Visit via Telephone Note    Evaluation Performed:  Follow-up visit  This visit type was conducted due to national recommendations for restrictions regarding the COVID-19 Pandemic (e.g. social distancing).  This format is felt to be most appropriate for this patient at this time.  All issues noted in this document were discussed and addressed.  No physical exam was performed (except for noted visual exam findings with Video Visits).  Please refer to the patient's chart (MyChart message for video visits and phone note for telephone visits) for the patient's consent to telehealth for [facility].  Date:  09/03/2018   ID:  Taylor Miranda, DOB 09-27-1995, MRN [id number]  Patient Location:  Home  Provider location:   Home  PCP:  Taylor Nest, NP  Cardiologist:  Taylor Kendall, MD  Electrophysiologist:  None   Chief Complaint:  Follow up  History of Present Illness:    Taylor Miranda is a 23 y.o. female who presents via audio/video conferencing for a telehealth visit today.  She has a history of chronic chest pain, chronic cough, HLD, and depression.  Patient was previously followed by pediatric cardiology at [facility] with symptoms being attributed to growing pains. It appeared she did not have workup beyond EKG at that time. GI testing was recommended, though not pursued. She reported a history of falling off a hoarse at least twice as a child as well as a splenic laceration in 2011 secondary to a fall. She was seen by Dr. Okey Dupre as a new patient on 04/30/2018 with continued intermittent episodes of chest pain. Sometimes, the pain would last several days in a row. She reported being able to go a week without symptoms at times. The pain was located in the center and left side of her chest, often radiated to the neck/jaw and left arm. There was associated SOB and at time palpitations, though at times her palpitations were not associated with her chest pain. The pain was not noted to be  exertional and would last from 10-15 minutes to several hours. Pain ranged from a 3 to 8/10. 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 EKG at her visit on 04/30/2018 showed sinus rhythm with early R wave transition. Her pain was felt to be most likely MSK or GI in etiology. Echo was obtained on 04/30/2018 and showed an EF of 55-60%, no RWMA, normal LV diastolic function, RV cavity size was normal with normal RVSF. She was started on empiric naproxen. GXT on 05/15/2018 showed good exercise capacity with normal heart rate and BP responses without angina being reported. No significant arrhythmia was noted. She exercised for 10 minutes and 39 seconds, achieving a maximum heart rate of 190 bpm and peak BP of 163/77. This was a low risk ETT.  In follow-up in 06/2018 she reported feeling about the same. She denied any alcohol, tobacco, or illegal drug abuse.    She denied any energy drinks and reported drinking 1 diet soda every other day when she is not on her 15 days straight of work.  She was no longer taking naproxen as she did not feel like this led to any symptom improvement.  Given her ongoing symptoms, she underwent cardiac CT on 07/09/2018 that showed no significant extracardiac findings.  Cardiac findings showed a calcium score of 0 with a normal right dominant coronary arterial system with no anomalies and normal sized aortic root measuring 2.4 cm.  In speaking with  the patient over the phone today she continues to feel the same.  Symptoms are no worse.  She continues to describe intermittent episodes of chest pain that is worse with coughing and not associated with exertion.  Symptoms will last anywhere from 10 to 15 minutes to several hours.  Pain is not positional.  Intermittently, there are associated palpitations and shortness of breath.  Currently symptom-free.  The patient does not have symptoms concerning for COVID-19  infection (fever, chills, cough, or new shortness of breath).    Prior CV studies:   The following studies were reviewed today:  2D echo 04/2018:  - Left ventricle: The cavity size was normal. Wall thickness was   normal. Systolic function was normal. The estimated ejection   fraction was in the range of 55% to 60%. Wall motion was normal;   there were no regional wall motion abnormalities. Left   ventricular diastolic function parameters were normal. - Right ventricle: The cavity size was normal. Wall thickness was   normal. Systolic function was normal. __________  ETT 05/2018:  Normal baseline EKG.  Patient demonstrates good exercise capacity with normal heart rate and blood pressure responses. No angina reported.  No ST segment or T wave abnormalities noted in stress or recovery.  No significant arrhythmia noted during stress or recovery.  Low risk exercise tolerance test (Duke treadmill score = 10).   Low risk exercise tolerance test without evidence of ischemia or significant arrhythmia. _________  Cardiac CTA 07/2018:  1.  Calcium score 0 2.  Normal right dominant coronary arteries with no anomalies 3.  Normal aortic root 2.4 cm __________  Past Medical History:  Diagnosis Date   ADD (attention deficit disorder)    Chronic kidney disease    Kidney Reflux    Complication of anesthesia    Cough    PT STATES SHE HAS BEEN HAVING A BAD COUGH X 1.5 YEARS DURING THE WINTERTIME MOSTLY THAT HER PCP CANNOT FIGURE OUT WHY-CURRENTLY PT DOES NOT HAVE THIS COUGH AS OF 05-23-17   Depression    Family history of adverse reaction to anesthesia    pt is adopted and unsure of family history    History of UTI    Hyperlipidemia    Oppositional defiant disorder    PONV (postoperative nausea and vomiting)    PT STATES SHE VOMITS STARTING THE DAY AFTER SURGERY   Self mutilating behavior    Past Surgical History:  Procedure Laterality Date   EPIGASTRIC HERNIA  REPAIR N/A 05/30/2017   Procedure: HERNIA REPAIR EPIGASTRIC ADULT x2;  Surgeon: [name], MD;  Location: [facility] ORS;  Service: General;  Laterality: N/A;   KIDNEY SURGERY  2004   Reflux   WISDOM TOOTH EXTRACTION       Current Meds  Medication Sig   XULANE 150-35 MCG/24HR transdermal patch PLACE 1 PATCH ONTO THE SKIN ONCE A WEEK ON SUNDAY     Allergies:   Patient has no known allergies.   Social History   Tobacco Use   Smoking status: Never Smoker   Smokeless tobacco: Never Used  Substance Use Topics   Alcohol use: No   Drug use: Not Currently    Types: Marijuana    Comment: Chart indicates prior marijuana use     Family Hx: The patient's family history includes Heart Problems in her mother; Heart attack in her maternal grandfather. She was adopted.  ROS:   Please see the history of present illness.  All other systems reviewed and are negative.   Labs/Other Tests and Data Reviewed:    Recent Labs: 02/14/2018: ALT 13; BUN 12; Creat 0.81; Hemoglobin 14.0; Platelets 265; Potassium 4.1; Sodium 137; TSH 2.92   Recent Lipid Panel Lab Results  Component Value Date/Time   CHOL 214 (H) 02/14/2018 04:39 PM   TRIG 134 02/14/2018 04:39 PM   HDL 66 02/14/2018 04:39 PM   CHOLHDL 3.2 02/14/2018 04:39 PM   LDLCALC 123 (H) 02/14/2018 04:39 PM    Wt Readings from Last 3 Encounters:  09/03/18 140 lb (63.5 kg)  06/20/18 142 lb 12 oz (64.8 kg)  04/30/18 137 lb 12 oz (62.5 kg)     Exam:    Vital Signs:  BP 135/76 (BP Location: Left Arm, Patient Position: Sitting)    Pulse 79    Ht 5\' 3"  (1.6 m)    Wt 140 lb (63.5 kg) Comment: Patient verbalized weight but did not have access to scale.   BMI 24.80 kg/m    Well nourished, well developed female in no acute distress.   ASSESSMENT & PLAN:    1.  Chronic atypical chest pain: -Extensive cardiac work-up has been unrevealing of etiology -She reports having previously been evaluated by outside cardiology group as  well as pulmonology and gastroenterology all with unrevealing work-up -Empiric NSAID was without improvement -No further cardiac testing is needed at this time -I will reach out to her PCP to gauge her thoughts on appropriate next step and forwarded to her primary cardiologist as an FYI  2.  Chronic cough: -She reports prior evaluation by pulmonology and gastroenterology with no etiology being determined -She does indicate PFTs in the past showed "my lungs were not functioning at total capacity" -Uncertain if repeating pulmonology evaluation would be of benefit, deferred to PCP -Uncertain if empiric PPI would be of benefit as well  COVID-19 Education: The signs and symptoms of COVID-19 were discussed with the patient and how to seek care for testing (follow up with PCP or arrange E-visit).  The importance of social distancing was discussed today.  Patient Risk:   After full review of this patients clinical status, I feel that they are at least moderate risk at this time.  Time:   Today, I have spent 11 minutes with the patient with telehealth technology discussing the above.     Medication Adjustments/Labs and Tests Ordered: Current medicines are reviewed at length with the patient today.  Concerns regarding medicines are outlined above.  Tests Ordered: No orders of the defined types were placed in this encounter.  Medication Changes: No orders of the defined types were placed in this encounter.   Disposition:  Follow up prn  Signed, Eula Listen, PA-C  09/03/2018 8:55 AM    [facility]

## 2018-09-03 ENCOUNTER — Telehealth (INDEPENDENT_AMBULATORY_CARE_PROVIDER_SITE_OTHER): Payer: Managed Care, Other (non HMO) | Admitting: Physician Assistant

## 2018-09-03 ENCOUNTER — Other Ambulatory Visit: Payer: Self-pay

## 2018-09-03 ENCOUNTER — Encounter: Payer: Self-pay | Admitting: Physician Assistant

## 2018-09-03 VITALS — BP 135/76 | HR 79 | Ht 63.0 in | Wt 140.0 lb

## 2018-09-03 DIAGNOSIS — R0789 Other chest pain: Secondary | ICD-10-CM

## 2018-09-03 DIAGNOSIS — R05 Cough: Secondary | ICD-10-CM

## 2018-09-03 DIAGNOSIS — R053 Chronic cough: Secondary | ICD-10-CM

## 2018-09-03 DIAGNOSIS — G8929 Other chronic pain: Secondary | ICD-10-CM

## 2018-09-03 DIAGNOSIS — R079 Chest pain, unspecified: Secondary | ICD-10-CM

## 2018-09-03 NOTE — Patient Instructions (Signed)
It was a pleasure to speak with you on the phone today! Thank you for allowing [place] to continue taking care of your [facility] needs during this time.   Feel free to call as needed for questions and concerns related to your cardiac needs.   Medication Instructions:  Your physician recommends that you continue on your current medications as directed. Please refer to the Current Medication list given to you today.  If you need a refill on your cardiac medications before your next appointment, please call your pharmacy.   Lab work: None ordered  If you have labs (blood work) drawn today and your tests are completely normal, you will receive your results only by: Marland Kitchen MyChart Message (if you have MyChart) OR . A paper copy in the mail If you have any lab test that is abnormal or we need to change your treatment, we will call you to review the results.  Testing/Procedures: None ordered   Follow-Up: At [facility], you and your health needs are our priority.  As part of our continuing mission to provide you with exceptional heart care, we have created designated Provider Care Teams.  These Care Teams include your primary Cardiologist (physician) and Advanced Practice Providers (APPs -  Physician Assistants and Nurse Practitioners) who all work together to provide you with the care you need, when you need it. Follow up as needed. Eula Listen, PA will report your results to your PCP.

## 2018-09-03 NOTE — Progress Notes (Signed)
Thanks for the update.  I agree that her pain does not appear to be cardiac in nature; no further cardiac testing is indicated at this time.  Thayer Ohm

## 2018-11-24 ENCOUNTER — Telehealth: Payer: Self-pay | Admitting: Primary Care

## 2018-11-24 NOTE — Telephone Encounter (Signed)
Best number [phone] Pt called stating her cardiologist [facility] was going to get in touch with you regarding pt having chest pains.  And she wanted to know what is the next steps she needs to do

## 2018-11-24 NOTE — Telephone Encounter (Signed)
Please have patient schedule either a virtual or in person visit regarding her chest pain. It seems as though she's had a complete cardiac work up which was negative.

## 2018-11-25 NOTE — Telephone Encounter (Signed)
Per DPR, left detail message of Taylor Miranda comments for patient to call back to schedule appointment.

## 2018-11-25 NOTE — Telephone Encounter (Signed)
Pt called back, virtual appt scheduled 6/24

## 2018-11-26 ENCOUNTER — Ambulatory Visit (INDEPENDENT_AMBULATORY_CARE_PROVIDER_SITE_OTHER): Payer: Managed Care, Other (non HMO) | Admitting: Primary Care

## 2018-11-26 DIAGNOSIS — R05 Cough: Secondary | ICD-10-CM

## 2018-11-26 DIAGNOSIS — R053 Chronic cough: Secondary | ICD-10-CM

## 2018-11-26 DIAGNOSIS — G8929 Other chronic pain: Secondary | ICD-10-CM

## 2018-11-26 DIAGNOSIS — R079 Chest pain, unspecified: Secondary | ICD-10-CM

## 2018-11-26 MED ORDER — OMEPRAZOLE 20 MG PO CPDR: 20.0000 mg | DELAYED_RELEASE_CAPSULE | Freq: Every day | ORAL | 0 refills | Status: DC

## 2018-11-26 NOTE — Assessment & Plan Note (Signed)
Underwent very thorough work up per cardiology over the last 6+ months, all results were without cause to symptoms.   Unclear etiology, not cardiac, doesn't seem to be MSK. Could be anxiety related although she denies. Will start omeprazole 20 mg daily for potential silent reflux. Will also get her in with pulmonology for PFT's and further evaluation as her prior pulmonology work up was "inconclusive"  She will update.

## 2018-11-26 NOTE — Progress Notes (Signed)
Subjective:    Patient ID: Taylor Miranda, female    DOB: 09-27-1995, 23 y.o.   MRN: [id number]  HPI  Virtual Visit via Video Note  I connected with Taylor Miranda on 11/26/18 at 10:40 AM EDT by a video enabled telemedicine application and verified that I am speaking with the correct person using two identifiers.  Location: Patient: Home Provider: Office   I discussed the limitations of evaluation and management by telemedicine and the availability of in person appointments. The patient expressed understanding and agreed to proceed.  History of Present Illness:  Taylor Miranda is a 23 year old female with a history of epigastric hernia, chronic chest pain, chronic cough who presents today with a chief complaint of chest pain.   She's had intermittent chest pain for years and has undergone both cardiology and pulmonology work up in the past. She has recently been following with Dr. Okey DupreEnd (cardiology) and Eula Listenyan Dunn PA-C with her last visit being in early April 2020.  She's undergone ECG's; echocardiogram (November 2019) which was unremarkable; exercise stress test (December 2019) which showed good exercise capacity, normal HR and BP, no arrhythmia; cardiac CT (February 2020) which was unremarkable, calcium score of 0 with normal right dominant coronary arterial system. She was trialed on naproxen without improvement.  During her visit in early April 2020 with cardiology she continued to report chronic chest pain with chronic cough. She tried Naproxen without improvement. Cardiology felt that her symptoms were not cardiac related given extensive testing and referred her back to PCP.   Today she endorses chronic shortness of breath and chronic cough. She feels as though her lungs will "spasm" which will cause her coughing spell. She's tried antihistamines, inhaler's (thinks this was albuterol but not sure), "breathing machines", in the past without improvement in her cough. The inhalers and "breathing  machines" made her breathing worse.   Her chest pain is still located to the substernal region with some radiation to the left chest, will occur with rest and exertion. She also experiences intermittent palpitations with and without chest pain. Pain is intermittent and will sometimes not occur at all.    Observations/Objective:  Alert and oriented. Appears well, not sickly. No distress. Speaking in complete sentences.  No cough during visit.  Assessment and Plan:  See problem based charting.  Follow Up Instructions:  You will be contacted regarding your referral to the pulmonologist.  Please let us know if you have not been contacted within one week.   Start taking omeprazole 20 mg once daily for cough and chest pain. Take this for at least 3-4 weeks.  It was a pleasure to see you today!    I discussed the assessment and treatment plan with the patient. The patient was provided an opportunity to ask questions and all were answered. The patient agreed with the plan and demonstrated an understanding of the instructions.   The patient was advised to call back or seek an in-person evaluation if the symptoms worsen or if the condition fails to improve as anticipated.     Doreene NestKatherine K Geneva Barrero, NP    Review of Systems  Constitutional: Negative for fever.  HENT: Negative for postnasal drip and sore throat.   Eyes: Negative for visual disturbance.  Respiratory: Positive for cough. Negative for shortness of breath.   Cardiovascular: Positive for chest pain and palpitations.  Neurological: Negative for dizziness.       Past Medical History:  Diagnosis Date  . ADD (attention deficit  disorder)   . Chronic kidney disease    Kidney Reflux   . Complication of anesthesia   . Cough    PT STATES SHE HAS BEEN HAVING A BAD COUGH X 1.5 YEARS DURING THE WINTERTIME MOSTLY THAT HER PCP CANNOT FIGURE OUT WHY-CURRENTLY PT DOES NOT HAVE THIS COUGH AS OF 05-23-17  . Depression   . Family  history of adverse reaction to anesthesia    pt is adopted and unsure of family history   . History of UTI   . Hyperlipidemia   . Oppositional defiant disorder   . PONV (postoperative nausea and vomiting)    PT STATES SHE VOMITS STARTING THE DAY AFTER SURGERY  . Self mutilating behavior      Social History   Socioeconomic History  . Marital status: Single    Spouse name: Not on file  . Number of children: Not on file  . Years of education: Not on file  . Highest education level: Not on file  Occupational History  . 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 Financial resource strain: Not on file  . Food insecurity    Worry: Not on file    Inability: Not on file  . Transportation needs    Medical: Not on file    Non-medical: Not on file  Tobacco Use  . Smoking status: Never Smoker  . Smokeless tobacco: Never Used  Substance and Sexual Activity  . Alcohol use: No  . Drug use: Not Currently    Types: Marijuana    Comment: Chart indicates prior marijuana use  . Sexual activity: Never  Lifestyle  . Physical activity    Days per week: Not on file    Minutes per session: Not on file  . Stress: Not on file  Relationships  . Social Herbalist on phone: Not on file    Gets together: Not on file    Attends religious service: Not on file    Active member of club or organization: Not on file    Attends meetings of clubs or organizations: Not on file    Relationship status: Not on file  . Intimate partner violence    Fear of current or ex partner: Not on file    Emotionally abused: Not on file    Physically abused: Not on file    Forced sexual activity: Not on file  Other Topics Concern  . Not on file  Social History Narrative  . Not on file    Past Surgical History:  Procedure Laterality Date  . EPIGASTRIC HERNIA REPAIR N/A 05/30/2017   Procedure: HERNIA REPAIR EPIGASTRIC ADULT x2;  Surgeon: Robert Bellow, MD;   Location: [facility] ORS;  Service: General;  Laterality: N/A;  . KIDNEY SURGERY  2004   Reflux  . WISDOM TOOTH EXTRACTION      Family History  Adopted: Yes  Problem Relation Age of Onset  . Heart Problems Mother   . Heart attack Maternal Grandfather     No Known Allergies  Current Outpatient Medications on File Prior to Visit  Medication Sig Dispense Refill  . XULANE 150-35 MCG/24HR transdermal patch PLACE 1 PATCH ONTO THE SKIN ONCE A WEEK ON SUNDAY  3   No current facility-administered medications on file prior to visit.     Wt 142 lb (64.4 kg)   BMI 25.15 kg/m    Objective:   Physical Exam  Constitutional: She is oriented  to person, place, and time. She appears well-nourished.  Respiratory: Effort normal. No respiratory distress.  No cough during visit  Neurological: She is alert and oriented to person, place, and time.  Psychiatric: She has a normal mood and affect.           Assessment & Plan:

## 2018-11-26 NOTE — Assessment & Plan Note (Signed)
Chronic, "inconclusive" work up by prior pulmonologist years ago per patient.   Could be silent reflux, undiagnosed interstitial lung disease, anxiety, allergies.   Will have her start with omeprazole 20 mg for potential silent reflux. Referral placed to pulmonology for further evaluation and PFT's.

## 2018-11-26 NOTE — Patient Instructions (Signed)
You will be contacted regarding your referral to the pulmonologist.  Please let us know if you have not been contacted within one week.   Start taking omeprazole 20 mg once daily for cough and chest pain. Take this for at least 3-4 weeks.  It was a pleasure to see you today!

## 2018-11-28 NOTE — Progress Notes (Signed)
[facility] Consultation    Virtual Visit via Video Note I connected with patient on 12/01/18 at 10:30 AM EDT by video and verified that I am speaking with the correct person using two identifiers.   I discussed the limitations, risks of performing an evaluation and management service by video and the availability of in person appointments. I also discussed with the patient that there may be a patient responsible charge related to this service.  In light of current covid-19 pandemic, patient also understands that we are trying to protect them by minimizing in office contact if at all possible.  The patient expressed understanding and agreed to proceed. Please see note below for further detail.    The patient was advised to call back or seek an in-person evaluation if the symptoms worsen or if the condition fails to improve as anticipated.   Shane CrutchPradeep Camauri Fleece, MD   Assessment and Plan:  Cough. - Uncertain etiology, has been present for at least 4 to 5 years with no significant relieving factors.  Has been exacerbated in the past with inhalers and nebulizers.  Cardiac CT lung images reviewed which appeared to be unremarkable. 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 - We will start empirically on Tessalon Perles. - We will check Rast, PFT, methacholine challenge.  Orders Placed This Encounter  Procedures  . Allergens w/Total IgE Area 2  . Pulmonary Function Test [facility] Only  . Pulmonary Function Test [facility] Only   Meds ordered this encounter  Medications  . Benzonatate 150 MG CAPS    Sig: Take 1 capsule (150 mg total) by mouth 3 (three) times daily.    Dispense:  42 capsule    Refill:  3   .  Date: 12/01/2018  MRN# [id number] Taylor Miranda May 15, 1996   Taylor Miranda is a 23 y.o. old female seen in consultation for chief complaint of: Allayne GitelmanK Clark, NP for dyspnea.    HPI:  Taylor Miranda is a 23 y.o. old female she was seen by  cardiology recently due to chest pain, she had an unremarkable stress test, and echocardiogram.She has been having chronic cough for several years. She went to a Ped[facility] pulmonologist about 4 years ago but not really sure of the diagnosis, unsure of where it was.   The cough started about 6 or 7 years ago, it has continued since that time and has never really gone away. She was seen 4 years ago because she was coughing up blood and mucus, she had some testing done and a CXR, PFT.  She was tried on an inhaler and nebulizer but felt that theymade the cough worse. Tried on allergy med, did not help.   No medication or other relieving factors, other than black licorice hard candies. Worse with inhalers, cold weather, physical activity. Wakes her from sleep "pretty often".  Cough lozenges do not help, has been tried on tessalon.  Has had pneumonia when she was in 4th grade.  Denies reflux symptoms but is taking meds and is being tested for it.  She has a cat, sleep in bed she has not been tested for allergies.  Her husband smokes.  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 She wears a mask some of the time.   She's undergone ECG's; echocardiogram (November 2019) which was unremarkable; exercise stress test (December 2019) which showed good exercise capacity, normal HR  and BP, no arrhythmia; cardiac CT (February 2020) which was unremarkable, calcium score of 0 with normal right dominant coronary arterial system. She was trialed on naproxen without improvement.  CT cardiac chest windows 07/09/18>> Images personally review, normal lungs.   PMHX:   Past Medical History:  Diagnosis Date  . ADD (attention deficit disorder)   . Chronic kidney disease    Kidney Reflux   . Complication of anesthesia   . Cough    PT STATES SHE HAS BEEN HAVING A BAD COUGH X 1.5 YEARS DURING THE WINTERTIME MOSTLY THAT HER PCP CANNOT FIGURE OUT WHY-CURRENTLY PT DOES NOT HAVE THIS COUGH  AS OF 05-23-17  . Depression   . Family history of adverse reaction to anesthesia    pt is adopted and unsure of family history   . History of UTI   . Hyperlipidemia   . Oppositional defiant disorder   . PONV (postoperative nausea and vomiting)    PT STATES SHE VOMITS STARTING THE DAY AFTER SURGERY  . Self mutilating behavior    Surgical Hx:  Past Surgical History:  Procedure Laterality Date  . EPIGASTRIC HERNIA REPAIR N/A 05/30/2017   Procedure: HERNIA REPAIR EPIGASTRIC ADULT x2;  Surgeon: Earline MayotteByrnett, Jeffrey W, MD;  Location: [facility] ORS;  Service: General;  Laterality: N/A;  . KIDNEY SURGERY  2004   Reflux  . WISDOM TOOTH EXTRACTION     Family Hx:  Family History  Adopted: Yes  Problem Relation Age of Onset  . Heart Problems Mother   . Heart attack Maternal Grandfather    Social Hx:   Social History   Tobacco Use  . Smoking status: Never Smoker  . Smokeless tobacco: Never Used  Substance Use Topics  . Alcohol use: No  . Drug use: Not Currently    Types: Marijuana    Comment: Chart indicates prior marijuana use   Medication:    Current Outpatient Medications:  .  omeprazole (PRILOSEC) 20 MG capsule, Take 1 capsule (20 mg total) by mouth daily., Disp: 30 capsule, Rfl: 0 .  XULANE 150-35 MCG/24HR transdermal patch, PLACE 1 PATCH ONTO THE SKIN ONCE A WEEK ON SUNDAY, Disp: , Rfl: 3   Allergies:  Patient has no known allergies.  Review of Systems: Gen:  Denies  fever, sweats, chills HEENT: Denies blurred vision, double vision. bleeds, sore throat Cvc:  No dizziness, chest pain. Resp:   Denies hemoptysis, chest tightness. Gi: Denies swallowing difficulty, stomach pain. Gu:  Denies bladder incontinence, burning urine Ext:   No Joint pain, stiffness. Skin: No skin rash,  hives  Endoc:  No polyuria, polydipsia. Psych: No depression, insomnia. Other:  All other systems were reviewed with the patient and were negative other that what is mentioned in the HPI.   Physical  Examination:  --    LABORATORY PANEL:   CBC No results for input(s): WBC, HGB, HCT, PLT in the last 168 hours. ------------------------------------------------------------------------------------------------------------------  Chemistries  No results for input(s): NA, K, CL, CO2, GLUCOSE, BUN, CREATININE, CALCIUM, MG, AST, ALT, ALKPHOS, BILITOT in the last 168 hours.  Invalid input(s): GFRCGP ------------------------------------------------------------------------------------------------------------------  Cardiac Enzymes No results for input(s): TROPONINI in the last 168 hours. ------------------------------------------------------------  RADIOLOGY:  No results found.     Thank  you for the consultation and for allowing [facility] to assist in the care of your patient. Our recommendations are noted above.  Please contact us if we can be of further service.   Wells Guileseep Alesa Echevarria, M.D., F.C.C.P.  Board Certified in Internal Medicine, Pulmonary Medicine, [place], and Sleep Medicine.  [facility] Office Number: [phone]   12/01/2018

## 2018-12-01 ENCOUNTER — Other Ambulatory Visit: Payer: Self-pay

## 2018-12-01 ENCOUNTER — Telehealth: Payer: Self-pay | Admitting: Internal Medicine

## 2018-12-01 ENCOUNTER — Ambulatory Visit (INDEPENDENT_AMBULATORY_CARE_PROVIDER_SITE_OTHER): Payer: Managed Care, Other (non HMO) | Admitting: Internal Medicine

## 2018-12-01 DIAGNOSIS — R059 Cough, unspecified: Secondary | ICD-10-CM

## 2018-12-01 DIAGNOSIS — J4540 Moderate persistent asthma, uncomplicated: Secondary | ICD-10-CM | POA: Diagnosis not present

## 2018-12-01 DIAGNOSIS — R05 Cough: Secondary | ICD-10-CM

## 2018-12-01 MED ORDER — BENZONATATE 150 MG PO CAPS: 150.0000 mg | ORAL_CAPSULE | Freq: Three times a day (TID) | ORAL | 3 refills | Status: DC

## 2018-12-01 NOTE — Addendum Note (Signed)
Addended by: Maryanna Shape A on: 12/01/2018 01:36 PM   Modules accepted: Orders

## 2018-12-01 NOTE — Telephone Encounter (Signed)
Received message from [facility] stating that Benzonatate 150mg  is not in stock.  Per epic, this medication was preferred over tessalon.  Left message with pt to obtain another pharmacy that she may use, to see if they have it in stock.

## 2018-12-01 NOTE — Telephone Encounter (Signed)
Spoke with patient. Advised pt that the doctor recommended allergy lab. Explained that it is an expensive test that may not be covered under her insurance plan and could result in a rather large out of pocket expense. Pt was advised to contact her insurance company to verify coverage prior to test and if she wishes to proceed to just come to the [place] to check in for her test. Pt verbalized understanding.

## 2018-12-01 NOTE — Telephone Encounter (Signed)
Called and spoke to Dale at [facility]. He stated that the 150 mg Benzonatate is not covered and is not stocked at [facility]. Verbal order per Dr. Juanell Fairly, we switched it to 200 mg TID. Truman Hayward is changing on his end and will fill it for the patient.

## 2018-12-01 NOTE — Telephone Encounter (Signed)
Spoke to pt and relayed below message. Pt asked that we cancel all test and Rx, as she can not afford it at this time.  [facility]oke to [facility] and requested that Rx for Benzonatate be canceled. Allergy testing and PFT has been canceled.  Pt stated that she will call back when she is ready to proceed with test.  Nothing further is needed at this.

## 2018-12-01 NOTE — Patient Instructions (Signed)
Will start Tessalon cough medicine, take it for the next month to see if it helps with her cough if not may stop it. We will check a blood allergy test. 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

## 2018-12-01 NOTE — Telephone Encounter (Signed)
Please see earlier phone note dated at 12/01/2018.

## 2018-12-03 ENCOUNTER — Other Ambulatory Visit: Payer: Self-pay

## 2018-12-03 NOTE — Telephone Encounter (Signed)
Barbara with scheduling is aware that PFT has been canceled.

## 2018-12-21 ENCOUNTER — Other Ambulatory Visit: Payer: Self-pay | Admitting: Primary Care

## 2018-12-21 DIAGNOSIS — R05 Cough: Secondary | ICD-10-CM

## 2018-12-21 DIAGNOSIS — R053 Chronic cough: Secondary | ICD-10-CM

## 2019-01-02 ENCOUNTER — Ambulatory Visit: Payer: Managed Care, Other (non HMO)

## 2019-01-12 ENCOUNTER — Ambulatory Visit: Payer: Managed Care, Other (non HMO) | Admitting: Internal Medicine

## 2019-06-08 ENCOUNTER — Telehealth: Payer: Self-pay | Admitting: Primary Care

## 2019-06-08 NOTE — Telephone Encounter (Signed)
Fine with me, I haven't seen her in quite some time.

## 2019-06-08 NOTE — Telephone Encounter (Signed)
Patient would like to toc from [facility] to Taylor Miranda  Is this transfer okay

## 2019-06-09 NOTE — Telephone Encounter (Signed)
That is fine with me. Please call her to schedule.  

## 2019-07-03 ENCOUNTER — Other Ambulatory Visit: Payer: Self-pay

## 2019-07-03 ENCOUNTER — Encounter: Payer: Self-pay | Admitting: Family Medicine

## 2019-07-03 ENCOUNTER — Ambulatory Visit: Payer: Managed Care, Other (non HMO) | Admitting: Family Medicine

## 2019-07-03 ENCOUNTER — Encounter: Payer: Managed Care, Other (non HMO) | Admitting: Primary Care

## 2019-07-03 VITALS — BP 122/62 | HR 88 | Temp 98.0°F | Ht 63.0 in | Wt 150.0 lb

## 2019-07-03 DIAGNOSIS — Z0000 Encounter for general adult medical examination without abnormal findings: Secondary | ICD-10-CM | POA: Diagnosis not present

## 2019-07-03 DIAGNOSIS — Z91410 Personal history of adult physical and sexual abuse: Secondary | ICD-10-CM

## 2019-07-03 DIAGNOSIS — R1033 Periumbilical pain: Secondary | ICD-10-CM

## 2019-07-03 DIAGNOSIS — M2550 Pain in unspecified joint: Secondary | ICD-10-CM

## 2019-07-03 DIAGNOSIS — Z2821 Immunization not carried out because of patient refusal: Secondary | ICD-10-CM

## 2019-07-03 DIAGNOSIS — M249 Joint derangement, unspecified: Secondary | ICD-10-CM

## 2019-07-03 DIAGNOSIS — Z8279 Family history of other congenital malformations, deformations and chromosomal abnormalities: Secondary | ICD-10-CM

## 2019-07-03 NOTE — Patient Instructions (Signed)
Good to see you today  You will get a call about genetic counseling  Try to stretch most days, walk for pleasure   Health Maintenance, Female Adopting a healthy lifestyle and getting preventive care are important in promoting health and wellness. Ask your health care provider about:  The right schedule for you to have regular tests and exams.  Things you can do on your own to prevent diseases and keep yourself healthy. What should I know about diet, weight, and exercise? Eat a healthy diet   Eat a diet that includes plenty of vegetables, fruits, low-fat dairy products, and lean protein.  Do not eat a lot of foods that are high in solid fats, added sugars, or sodium. Maintain a healthy weight Body mass index (BMI) is used to identify weight problems. It estimates body fat based on height and weight. Your health care provider can help determine your BMI and help you achieve or maintain a healthy weight. Get regular exercise Get regular exercise. This is one of the most important things you can do for your health. Most adults should:  Exercise for at least 150 minutes each week. The exercise should increase your heart rate and make you sweat (moderate-intensity exercise).  Do strengthening exercises at least twice a week. This is in addition to the moderate-intensity exercise.  Spend less time sitting. Even light physical activity can be beneficial. Watch cholesterol and blood lipids Have your blood tested for lipids and cholesterol at 22 years of age, then have this test every 5 years. Have your cholesterol levels checked more often if:  Your lipid or cholesterol levels are high.  You are older than 22 years of age.  You are at high risk for heart disease. What should I know about cancer screening? Depending on your health history and family history, you may need to have cancer screening at various ages. This may include screening for:  Breast cancer.  Cervical  cancer.  Colorectal cancer.  Skin cancer.  Lung cancer. What should I know about heart disease, diabetes, and high blood pressure? Blood pressure and heart disease  High blood pressure causes heart disease and increases the risk of stroke. This is more likely to develop in people who have high blood pressure readings, are of African descent, or are overweight.  Have your blood pressure checked: ? Every 3-5 years if you are 22-70 years of age. ? Every year if you are 22 years old or older. Diabetes Have regular diabetes screenings. This checks your fasting blood sugar level. Have the screening done:  Once every three years after age 22 if you are at a normal weight and have a low risk for diabetes.  More often and at a younger age if you are overweight or have a high risk for diabetes. What should I know about preventing infection? Hepatitis B If you have a higher risk for hepatitis B, you should be screened for this virus. Talk with your health care provider to find out if you are at risk for hepatitis B infection. Hepatitis C Testing is recommended for:  Everyone born from [birth year] through 1963.  Anyone with known risk factors for hepatitis C. Sexually transmitted infections (STIs)  Get screened for STIs, including gonorrhea and chlamydia, if: ? You are sexually active and are younger than 22 years of age. ? You are older than 22 years of age and your health care provider tells you that you are at risk for this type of infection. ? Your  sexual activity has changed since you were last screened, and you are at increased risk for chlamydia or gonorrhea. Ask your health care provider if you are at risk.  Ask your health care provider about whether you are at high risk for HIV. Your health care provider may recommend a prescription medicine to help prevent HIV infection. If you choose to take medicine to prevent HIV, you should first get tested for HIV. You should then be tested every 3  months for as long as you are taking the medicine. Pregnancy  If you are about to stop having your period (premenopausal) and you may become pregnant, seek counseling before you get pregnant.  Take 400 to 800 micrograms (mcg) of folic acid every day if you become pregnant.  Ask for birth control (contraception) if you want to prevent pregnancy. Osteoporosis and menopause Osteoporosis is a disease in which the bones lose minerals and strength with aging. This can result in bone fractures. If you are 22 years old or older, or if you are at risk for osteoporosis and fractures, ask your health care provider if you should:  Be screened for bone loss.  Take a calcium or vitamin D supplement to lower your risk of fractures.  Be given hormone replacement therapy (HRT) to treat symptoms of menopause. Follow these instructions at home: Lifestyle  Do not use any products that contain nicotine or tobacco, such as cigarettes, e-cigarettes, and chewing tobacco. If you need help quitting, ask your health care provider.  Do not use street drugs.  Do not share needles.  Ask your health care provider for help if you need support or information about quitting drugs. Alcohol use  Do not drink alcohol if: ? Your health care provider tells you not to drink. ? You are pregnant, may be pregnant, or are planning to become pregnant.  If you drink alcohol: ? Limit how much you use to 0-1 drink a day. ? Limit intake if you are breastfeeding.  Be aware of how much alcohol is in your drink. In the [place] one drink equals one 12 oz bottle of beer (355 mL), one 5 oz glass of wine (148 mL), or one 1 oz glass of hard liquor (44 mL). General instructions  Schedule regular health, dental, and eye exams.  Stay current with your vaccines.  Tell your health care provider if: ? You often feel depressed. ? You have ever been abused or do not feel safe at home. Summary  Adopting a healthy lifestyle and getting  preventive care are important in promoting health and wellness.  Follow your health care provider's instructions about healthy diet, exercising, and getting tested or screened for diseases.  Follow your health care provider's instructions on monitoring your cholesterol and blood pressure. This information is not intended to replace advice given to you by your health care provider. Make sure you discuss any questions you have with your health care provider. Document Revised: 05/14/2018 Document Reviewed: 05/14/2018 Elsevier Patient Education  2020 [employer].
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
## 2019-08-31 ENCOUNTER — Inpatient Hospital Stay: Payer: Managed Care, Other (non HMO) | Admitting: Licensed Clinical Social Worker

## 2019-09-15 ENCOUNTER — Other Ambulatory Visit: Payer: Self-pay | Admitting: Family Medicine

## 2019-09-15 DIAGNOSIS — Z8279 Family history of other congenital malformations, deformations and chromosomal abnormalities: Secondary | ICD-10-CM

## 2019-11-18 ENCOUNTER — Telehealth: Payer: Self-pay

## 2019-11-18 NOTE — Telephone Encounter (Signed)
Pt scheduled an appt with Debbie on 11/25/19 at 9:30am  Nothing further needed.

## 2019-11-18 NOTE — Telephone Encounter (Signed)
Pt left v/m that she saw Harlin Heys FNP 07/03/19 TOC and there was a discussion about pt having lab testing but pt declined and pt thinks she should have lab testing scheduled now. Please advise.  Pt request cb.

## 2019-11-18 NOTE — Telephone Encounter (Signed)
Please call patient and schedule her for a follow up visit.

## 2019-11-25 ENCOUNTER — Encounter: Payer: Self-pay | Admitting: Family Medicine

## 2019-11-25 ENCOUNTER — Other Ambulatory Visit: Payer: Self-pay

## 2019-11-25 ENCOUNTER — Ambulatory Visit: Payer: Managed Care, Other (non HMO) | Admitting: Family Medicine

## 2019-11-25 VITALS — BP 118/74 | HR 89 | Temp 98.0°F | Ht 63.0 in | Wt 148.1 lb

## 2019-11-25 DIAGNOSIS — R7982 Elevated C-reactive protein (CRP): Secondary | ICD-10-CM | POA: Diagnosis not present

## 2019-11-25 DIAGNOSIS — M2550 Pain in unspecified joint: Secondary | ICD-10-CM | POA: Diagnosis not present

## 2019-11-25 DIAGNOSIS — R5383 Other fatigue: Secondary | ICD-10-CM | POA: Diagnosis not present

## 2019-11-25 DIAGNOSIS — M249 Joint derangement, unspecified: Secondary | ICD-10-CM | POA: Diagnosis not present

## 2019-11-25 DIAGNOSIS — R7989 Other specified abnormal findings of blood chemistry: Secondary | ICD-10-CM

## 2019-11-25 LAB — CBC WITH DIFFERENTIAL/PLATELET
Basophils Absolute: 0.1 10*3/uL (ref 0.0–0.1)
Basophils Relative: 1 % (ref 0.0–3.0)
Eosinophils Absolute: 0.1 10*3/uL (ref 0.0–0.7)
Eosinophils Relative: 2.6 % (ref 0.0–5.0)
HCT: 39.3 % (ref 36.0–46.0)
Hemoglobin: 13.5 g/dL (ref 12.0–15.0)
Lymphocytes Relative: 48.2 % — ABNORMAL HIGH (ref 12.0–46.0)
Lymphs Abs: 2.7 10*3/uL (ref 0.7–4.0)
MCHC: 34.4 g/dL (ref 30.0–36.0)
MCV: 91.3 fl (ref 78.0–100.0)
Monocytes Absolute: 0.4 10*3/uL (ref 0.1–1.0)
Monocytes Relative: 7.3 % (ref 3.0–12.0)
Neutro Abs: 2.3 10*3/uL (ref 1.4–7.7)
Neutrophils Relative %: 40.9 % — ABNORMAL LOW (ref 43.0–77.0)
Platelets: 299 10*3/uL (ref 150.0–400.0)
RBC: 4.3 Mil/uL (ref 3.87–5.11)
RDW: 13.7 % (ref 11.5–15.5)
WBC: 5.6 10*3/uL (ref 4.0–10.5)

## 2019-11-25 LAB — COMPREHENSIVE METABOLIC PANEL
ALT: 17 U/L (ref 0–35)
AST: 16 U/L (ref 0–37)
Albumin: 4.1 g/dL (ref 3.5–5.2)
Alkaline Phosphatase: 51 U/L (ref 39–117)
BUN: 11 mg/dL (ref 6–23)
CO2: 24 mEq/L (ref 19–32)
Calcium: 9.2 mg/dL (ref 8.4–10.5)
Chloride: 105 mEq/L (ref 96–112)
Creatinine, Ser: 0.64 mg/dL (ref 0.40–1.20)
GFR: 115.66 mL/min (ref >60.00)
Glucose, Bld: 85 mg/dL (ref 70–99)
Potassium: 3.8 mEq/L (ref 3.5–5.1)
Sodium: 135 mEq/L (ref 135–145)
Total Bilirubin: 0.4 mg/dL (ref 0.2–1.2)
Total Protein: 6.7 g/dL (ref 6.0–8.3)

## 2019-11-25 LAB — TSH: TSH: 6.03 u[IU]/mL — ABNORMAL HIGH (ref 0.35–4.50)

## 2019-11-25 LAB — VITAMIN D 25 HYDROXY (VIT D DEFICIENCY, FRACTURES): VITD: 35.02 ng/mL (ref 30.00–100.00)

## 2019-11-25 LAB — HIGH SENSITIVITY CRP: CRP, High Sensitivity: 9.09 mg/L — ABNORMAL HIGH (ref 0.000–5.000)

## 2019-11-25 LAB — FERRITIN: Ferritin: 16.7 ng/mL (ref 10.0–291.0)

## 2019-11-25 NOTE — Progress Notes (Signed)
Subjective:    Patient ID: Taylor Miranda, female    DOB: 03/13/1996, 24 y.o.   MRN: [id number]  HPI Chief Complaint  Patient presents with  . Follow-up    increased leg swelling - discuss labs   Swelling after working and being on her feet with work. Gets some redness, "heat rash," at back of legs from her boots. Pain in bottom of feet to low back. Has been taking Alleve 2 or ibuprofen 5-6 tablets 2 times a week. Pain with standing long period of time, walking long period of time. Pain is not every day, usually every day stiff. Pain in wrists, fingers, hips, knees, ankles. Occasional back pain. Little stretching, no ice or heat, some help with epson salt baths.  At her visit several months ago, we had discussed doing labs to check for inflammatory disorders.  She is interested in that today.  She has a very physically demanding job, working as a Building control surveyor.  Her schedule is 8 days on 8 days off.  She works in very hot environments and does not often able to adequately hydrate or make frequent trips to the bathroom.  Her plan is to stay in this role for a couple of years until she becomes pregnant at which time she would like to transition to a job and safety.  She feels that she has physical consequences related to her work environment but her plan is to continue with her current job  History of major depression.  She reports that he she has her good days and her bad days.  Review of Systems Occasional headaches, + chest pain (has had 2 cardiology work ups), left side for a couple of minutes, + SOB (all the time) with pulmonary work up in past "lungs were not getting enough air", + chronic cough. Symptoms have stayed the same, ? more frequent.      Objective:   Physical Exam Vitals reviewed.  Constitutional:      General: She is not in acute distress.    Appearance: Normal appearance. She is normal weight. She is not ill-appearing, toxic-appearing or diaphoretic.  HENT:     Head:  Normocephalic and atraumatic.  Cardiovascular:     Rate and Rhythm: Normal rate.  Pulmonary:     Effort: Pulmonary effort is normal.  Musculoskeletal:     Comments: Hands with full range of motion.  Some increased size of PIPs, no obvious effusion, redness.  Skin:    General: Skin is warm and dry.  Neurological:     Mental Status: She is alert and oriented to person, place, and time.  Psychiatric:        Mood and Affect: Mood normal.        Behavior: Behavior normal.        Thought Content: Thought content normal.        Judgment: Judgment normal.       BP 118/74 (BP Location: Left Arm, Patient Position: Sitting, Cuff Size: Normal)   Pulse 89   Temp 98 F (36.7 C) (Temporal)   Ht 5\' 3"  (1.6 m)   Wt 148 lb 1.9 oz (67.2 kg)   SpO2 99%   BMI 26.24 kg/m  Wt Readings from Last 3 Encounters:  11/25/19 148 lb 1.9 oz (67.2 kg)  07/03/19 150 lb (68 kg)  11/26/18 142 lb (64.4 kg)       Assessment & Plan:  1. Hypermobility of joint - Comprehensive metabolic panel - Ferritin - CBC with Differential  2. Arthralgia, unspecified joint -We will check labs, I am unsure of her job contributes to her symptoms.  Discussed safe amounts of over-the-counter NSAIDs and encouraged her not to go above these. - Rheumatoid factor; Future - High sensitivity CRP - TSH - Vitamin D, 25-hydroxy  3. Fatigue, unspecified type - Comprehensive metabolic panel - Ferritin - CBC with Differential - TSH - Vitamin D, 25-hydroxy  This visit occurred during the SARS-CoV-2 public health emergency.  Safety protocols were in place, including screening questions prior to the visit, additional usage of staff PPE, and extensive cleaning of exam room while observing appropriate contact time as indicated for disinfecting solutions.    Olean Ree, FNP-BC  [facility], [facility]  11/26/2019 8:31 AM

## 2019-11-25 NOTE — Patient Instructions (Signed)
Good to see you today ° °I will notify you of labs °

## 2019-11-26 ENCOUNTER — Encounter: Payer: Self-pay | Admitting: Family Medicine

## 2019-11-26 NOTE — Addendum Note (Signed)
Addended by: Aquilla Solian on: 11/26/2019 09:55 AM   Modules accepted: Orders

## 2019-11-27 LAB — RHEUMATOID FACTOR: Rheumatoid fact SerPl-aCnc: <14 IU/mL (ref <14)

## 2019-11-28 NOTE — Addendum Note (Signed)
Addended by: Olean Ree B on: 11/28/2019 10:00 AM   Modules accepted: Orders

## 2019-12-25 ENCOUNTER — Other Ambulatory Visit (INDEPENDENT_AMBULATORY_CARE_PROVIDER_SITE_OTHER): Payer: Managed Care, Other (non HMO)

## 2019-12-25 ENCOUNTER — Ambulatory Visit: Payer: Managed Care, Other (non HMO) | Admitting: Family Medicine

## 2019-12-25 ENCOUNTER — Other Ambulatory Visit: Payer: Self-pay

## 2019-12-25 DIAGNOSIS — R7989 Other specified abnormal findings of blood chemistry: Secondary | ICD-10-CM | POA: Diagnosis not present

## 2019-12-25 DIAGNOSIS — R7982 Elevated C-reactive protein (CRP): Secondary | ICD-10-CM | POA: Diagnosis not present

## 2019-12-25 LAB — TSH: TSH: 4.34 u[IU]/mL (ref 0.35–4.50)

## 2019-12-25 LAB — T4, FREE: Free T4: 0.94 ng/dL (ref 0.60–1.60)

## 2019-12-25 LAB — HIGH SENSITIVITY CRP: CRP, High Sensitivity: 15.46 mg/L — ABNORMAL HIGH (ref 0.000–5.000)

## 2019-12-28 ENCOUNTER — Encounter: Payer: Self-pay | Admitting: Family Medicine

## 2020-02-15 ENCOUNTER — Encounter: Payer: Self-pay | Admitting: Family Medicine

## 2020-03-10 ENCOUNTER — Ambulatory Visit: Payer: Managed Care, Other (non HMO) | Attending: Obstetrics and Gynecology

## 2020-03-10 ENCOUNTER — Other Ambulatory Visit: Payer: Self-pay

## 2020-03-10 DIAGNOSIS — M4125 Other idiopathic scoliosis, thoracolumbar region: Secondary | ICD-10-CM

## 2020-03-10 DIAGNOSIS — M533 Sacrococcygeal disorders, not elsewhere classified: Secondary | ICD-10-CM

## 2020-03-10 DIAGNOSIS — M62838 Other muscle spasm: Secondary | ICD-10-CM | POA: Diagnosis present

## 2020-03-10 NOTE — Patient Instructions (Signed)
Look into Uqora or D-mannose as a supplement to help decrease UTI's.
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
## 2020-03-14 ENCOUNTER — Ambulatory Visit: Payer: Managed Care, Other (non HMO)

## 2020-03-15 ENCOUNTER — Other Ambulatory Visit: Payer: Self-pay

## 2020-03-15 ENCOUNTER — Ambulatory Visit: Payer: Managed Care, Other (non HMO)

## 2020-03-15 DIAGNOSIS — M62838 Other muscle spasm: Secondary | ICD-10-CM

## 2020-03-15 DIAGNOSIS — M533 Sacrococcygeal disorders, not elsewhere classified: Secondary | ICD-10-CM

## 2020-03-15 DIAGNOSIS — M4125 Other idiopathic scoliosis, thoracolumbar region: Secondary | ICD-10-CM

## 2020-03-15 NOTE — Patient Instructions (Addendum)
Flexors, Lunge  Hip Flexor Stretch: Proposal Pose    Maintain pelvic tuck under, lift pubic bone toward navel. Engage posterior hip muscles (firm glute muscles of leg in back position) and shift forward until you feel stretch on front of leg that is down. To increase stretch, maintain balance and ease hips forward. You may use one hand on a chair for balance if needed. Hold for __5__ breaths. Repeat __2-3__ times each leg.  Do _1-2__ times per day.  Pelvic Rotation: Contract / Relax (Supine)  MET to Correct Right Anteriorly Rotated/Left Posteriorly Rotated Innominate   Begin laying on your back with your feet at 90 degrees. Put a dowel/broomstick  through your legs, behind your right knee and in front of your left knee. Stabilize the dowel on ether side with your hands.  Press down with the right leg and up with the left leg. Hold for 5 seconds  then slowly relax. Repeat 5 times.
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
## 2020-03-17 ENCOUNTER — Telehealth: Payer: Self-pay | Admitting: Family Medicine

## 2020-03-17 NOTE — Telephone Encounter (Signed)
Patient called in checking on the status of referral for genetic testing. Looked and saw previous note of 1 year wait. Patient is wondering if there is another location she can go to relatively soon. Please advise.

## 2020-03-18 NOTE — Telephone Encounter (Signed)
Sent patient a mychart message.

## 2020-03-22 ENCOUNTER — Ambulatory Visit: Payer: Managed Care, Other (non HMO)

## 2020-03-22 ENCOUNTER — Other Ambulatory Visit: Payer: Self-pay

## 2020-03-22 DIAGNOSIS — M4125 Other idiopathic scoliosis, thoracolumbar region: Secondary | ICD-10-CM

## 2020-03-22 DIAGNOSIS — M62838 Other muscle spasm: Secondary | ICD-10-CM

## 2020-03-22 DIAGNOSIS — M533 Sacrococcygeal disorders, not elsewhere classified: Secondary | ICD-10-CM
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
## 2020-03-22 NOTE — Patient Instructions (Signed)
    Sit up with a tall spine and cross one leg over the other knee. Hinge from the hip and lean until you can feel a stretch through your bottom hold for 5 deep breaths and then switch sides. Repeat 2-3 times on each side.   

## 2020-03-29 ENCOUNTER — Ambulatory Visit: Payer: Managed Care, Other (non HMO)

## 2020-03-29 ENCOUNTER — Other Ambulatory Visit: Payer: Self-pay

## 2020-03-29 DIAGNOSIS — M4125 Other idiopathic scoliosis, thoracolumbar region: Secondary | ICD-10-CM | POA: Diagnosis not present

## 2020-03-29 DIAGNOSIS — M62838 Other muscle spasm: Secondary | ICD-10-CM

## 2020-03-29 DIAGNOSIS — M533 Sacrococcygeal disorders, not elsewhere classified: Secondary | ICD-10-CM
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
## 2020-03-29 NOTE — Patient Instructions (Signed)
Pelvic Tilt With Pelvic Floor (Hook-Lying)        Lie with hips and knees bent. Exhale and draw the low belly muscles in to the back and flatten low back while breathing out so that pelvis tilts slightly. Repeat _2x15__ times. Do _1-2__ times a day.    Exhale and press through the heel, feeling the glutes fire more than the low back. Do 2x15 for each side, making sure to let the muscle fully release between squeezes.

## 2020-03-31 ENCOUNTER — Other Ambulatory Visit: Payer: Self-pay | Admitting: Family Medicine

## 2020-03-31 DIAGNOSIS — R5383 Other fatigue: Secondary | ICD-10-CM

## 2020-03-31 DIAGNOSIS — M2550 Pain in unspecified joint: Secondary | ICD-10-CM

## 2020-03-31 DIAGNOSIS — R7982 Elevated C-reactive protein (CRP): Secondary | ICD-10-CM

## 2020-04-01 ENCOUNTER — Ambulatory Visit: Payer: Managed Care, Other (non HMO) | Admitting: Family Medicine

## 2020-04-01 ENCOUNTER — Other Ambulatory Visit: Payer: Self-pay

## 2020-04-01 ENCOUNTER — Encounter: Payer: Self-pay | Admitting: Family Medicine

## 2020-04-01 VITALS — BP 122/80 | HR 65 | Temp 98.6°F | Ht 63.0 in | Wt 148.0 lb

## 2020-04-01 DIAGNOSIS — M2550 Pain in unspecified joint: Secondary | ICD-10-CM | POA: Diagnosis not present

## 2020-04-01 DIAGNOSIS — R7982 Elevated C-reactive protein (CRP): Secondary | ICD-10-CM | POA: Diagnosis not present

## 2020-04-01 DIAGNOSIS — M357 Hypermobility syndrome: Secondary | ICD-10-CM | POA: Diagnosis not present

## 2020-04-01 DIAGNOSIS — R5383 Other fatigue: Secondary | ICD-10-CM

## 2020-04-01 LAB — HIGH SENSITIVITY CRP: CRP, High Sensitivity: 8.91 mg/L — ABNORMAL HIGH (ref 0.000–5.000)

## 2020-04-01 NOTE — Patient Instructions (Signed)
Good to see you today  I will send you a message when I get all of your labs back   Ehlers-Danlos Syndrome Ehlers-Danlos syndrome (EDS) is a group of genetic disorders that affect the body's connective tissues, which are made up of proteins. Connective tissues give support and elasticity to your skin, bones, joints, blood vessels, and internal organs. EDS causes connective tissues to become weak, fragile, and stretchable. People with EDS often have overly flexible joints and stretchy, fragile skin. There are 13 types of EDS, but many of those are rare. The most common types affect joints and skin. Other types affect the eyes, spine, or heart valves. The most severe types affect the walls of blood vessels and the structures of internal organs. What are the causes? This condition is caused by abnormal genes (genetic defects). Some types are passed down from parent to child (inherited) and others are not. Sometimes, the genes can become defective during development in the womb. What are the signs or symptoms? The signs and symptoms of EDS depend on the type. Signs and symptoms may start in childhood or later in life. Signs and symptoms of the common types include: Joint and muscle symptoms  Overly flexible joints that have a large range of motion and can dislocate easily.  Joint pain.  Weak, floppy muscles. Skin symptoms  Velvety, soft skin that stretches and sags.  Skin that bruises and tears easily.  Skin tears that are hard to repair with stitches (sutures) and heal with wide scars.  Lumps under the skin. Vision symptoms  Rupture of the eyeball or tearing of the tissue inside the eye (retinal detachment).  Changes in vision, including seeing floaters, flashes, or having sudden loss of vision. Vascular symptoms  Bleeding from tears or ruptures of internal organs or blood vessels.  Weak heart valves causing weakening of the heart. Orthopedic symptoms  Curved spine, which can  cause difficulty breathing.  Bowed limbs and short stature.  Muscle or tendon tears.  Weak, thin bones.  Club foot. Other symptoms  Collapsed lung.  Unusual facial features, such as a thin nose and lips, sunken cheeks, small ear lobes, and prominent eyes. How is this diagnosed? Your health care provider may suspect EDS based on your symptoms and medical history, especially if EDS runs in your family. Your health care provider will also do a physical exam. You may also have diagnostic tests including:  Blood tests to check for genetic defects.  Tissue tests to check for abnormal body proteins (collagen).  X-rays of the spine.  Imaging studies of internal organs (MRI or CT scan).  Bone scan.  Heart ultrasound (echocardiogram) to look for heart valve problems. How is this treated? There is no cure for this condition, but treatment can help symptoms and prevent complications. Treatment depends on the type of EDS and the symptoms it causes. Treatment options can include:  Over-the-counter pain medicines.  Vitamin C to strengthen skin.  Vitamin D and calcium to strengthen bones.  Blood pressure medicines to prevent blood vessel tears or ruptures.  Physical therapy to strengthen muscles and joints.  Assistive devices that help you move, like braces or a wheelchair, walker, or scooter.  Surgery to repair wounds, tears, joints, or ruptures. Follow these instructions at home:   Take over-the-counter and prescription medicines only as told by your health care provider.  Return to your normal activities as told by your health care provider. Ask your health care provider what activities are safe for you.  It may be important to avoid contact sports and heavy lifting.  Let your health care provider know if you become pregnant. You will need special care to prevent rupture of the womb.  Let all health care providers know that you have EDS before any surgery or procedure.  Wear  a medical alert bracelet.  Schedule eye exams often.  Consider having genetic counseling to learn if you may pass EDS on to your children. Contact a health care provider if:  You have joint pain.  You cut or tear your skin.  You have weakness or shortness of breath.  You have any new symptoms. Get help right away if you have:  Any sudden and severe pain.  Trouble breathing.  Chest pain.  Floaters or flashes in your vision or sudden loss of vision. Summary  Ehlers-Danlos syndrome (EDS) is a group of genetic disorders that affect the connective tissues of the body.  EDS is caused by abnormal genes (genetic defects).  The effects of EDS can range from having overly flexible joints and fragile skin to very serious complications, such as blood vessel or organ ruptures.  There is no cure for this condition, but treatment can relieve some symptoms and prevent complications. This information is not intended to replace advice given to you by your health care provider. Make sure you discuss any questions you have with your health care provider. Document Revised: 12/22/2018 Document Reviewed: 12/22/2018 Elsevier Patient Education  2020 [employer].

## 2020-04-01 NOTE — Progress Notes (Signed)
Subjective:    Patient ID: Taylor Miranda, female    DOB: May 23, 1996, 24 y.o.   MRN: [id number]  HPI Chief Complaint  Patient presents with  . Follow-up    EDS   This is a 24 yo female who presents today for follow up of fatigue, muscle/ joint pain. She has been doing ok. Has been going to pelvic floor PT which has been helpful for her scoliosis and sacrococcxygeal pain.   Hypermobility/ Lorinda Creed- strong family history with birth mother, sister and brother with ED. Long standing history of hypermobile joints, frequently feels like joints are sliding in and out of place, chest wall pain, fatigue, frequent light headedness, poor sleep with periods of insomnia, hypersomnolence, anxiety, easy bruising.   Elevated CRP- no identifiable source of infection. Discussed screening for Lupus given fatigue, chronic joint pain.    Review of Systems Per HPI    Objective:   Physical Exam Vitals reviewed.  Constitutional:      General: She is not in acute distress.    Appearance: Normal appearance. She is normal weight. She is not ill-appearing, toxic-appearing or diaphoretic.  HENT:     Head: Normocephalic and atraumatic.  Eyes:     Conjunctiva/sclera: Conjunctivae normal.  Cardiovascular:     Rate and Rhythm: Normal rate.  Pulmonary:     Effort: Pulmonary effort is normal.  Musculoskeletal:     Cervical back: Normal range of motion and neck supple.     Comments: Beighton scale- patient with demonstrated positive findings for Beighton Scale for all measures- finger, wrist, elbow knee, forward flexion with score 5 with Five point questionnaire 3/5.   Skin:    General: Skin is warm and dry.  Neurological:     Mental Status: She is alert and oriented to person, place, and time.  Psychiatric:        Mood and Affect: Mood normal.        Behavior: Behavior normal.        Thought Content: Thought content normal.        Judgment: Judgment normal.       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 No personal or family history concerning for vascular Ehlers Danlos. She was provided resources and we discussed next steps. Need to rule out auto immune disorder with persistently elevated CRP.  - currently managing symptoms with occasional [place], heat, PT - Other/Misc lab test  2. Arthralgia, unspecified joint - High sensitivity CRP - Other/Misc lab test  3. Fatigue, unspecified type - High sensitivity CRP - Other/Misc lab test  4. Elevated C-reactive protein (CRP) - High sensitivity CRP - Other/Misc lab test  This visit occurred during the SARS-CoV-2 public health emergency.  Safety protocols were in place, including screening questions prior to the visit, additional usage of staff PPE, and extensive cleaning of exam room while observing appropriate contact time as indicated for disinfecting solutions.    Lorinda Creed, FNP-BC  [facility], [facility]  04/03/2020 8:20 AM

## 2020-04-03 DIAGNOSIS — R7982 Elevated C-reactive protein (CRP): Secondary | ICD-10-CM | POA: Insufficient documentation

## 2020-04-03 DIAGNOSIS — M357 Hypermobility syndrome: Secondary | ICD-10-CM | POA: Insufficient documentation

## 2020-04-03 DIAGNOSIS — R5383 Other fatigue: Secondary | ICD-10-CM | POA: Insufficient documentation

## 2020-04-03 LAB — ANA SCREEN,IFA, WITH REFLEX TO TITER AND PATTERN/LUPUS PANEL1: Anti Nuclear Antibody (ANA): NEGATIVE

## 2020-04-07 ENCOUNTER — Ambulatory Visit: Payer: Managed Care, Other (non HMO)

## 2020-04-08 ENCOUNTER — Ambulatory Visit: Payer: Managed Care, Other (non HMO) | Attending: Obstetrics and Gynecology

## 2020-04-08 ENCOUNTER — Other Ambulatory Visit: Payer: Self-pay

## 2020-04-08 DIAGNOSIS — M4125 Other idiopathic scoliosis, thoracolumbar region: Secondary | ICD-10-CM | POA: Diagnosis not present

## 2020-04-08 DIAGNOSIS — M533 Sacrococcygeal disorders, not elsewhere classified: Secondary | ICD-10-CM

## 2020-04-08 DIAGNOSIS — M62838 Other muscle spasm: Secondary | ICD-10-CM

## 2020-04-08 NOTE — Patient Instructions (Signed)
   Place foam roller or towel under your upper back between your shoulder blades. Support your head with your hands, elbows forward, and gently rock back and forth and side to side to improve motion in your back.   Move the foam roller or towel up and down to a few spots in the upper back, repeating the process.    
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
## 2020-04-14 ENCOUNTER — Other Ambulatory Visit: Payer: Self-pay

## 2020-04-14 ENCOUNTER — Ambulatory Visit: Payer: Managed Care, Other (non HMO)

## 2020-04-14 DIAGNOSIS — M4125 Other idiopathic scoliosis, thoracolumbar region: Secondary | ICD-10-CM | POA: Diagnosis not present

## 2020-04-14 DIAGNOSIS — M533 Sacrococcygeal disorders, not elsewhere classified: Secondary | ICD-10-CM

## 2020-04-14 DIAGNOSIS — M62838 Other muscle spasm: Secondary | ICD-10-CM

## 2020-04-14 NOTE — Patient Instructions (Signed)
° °  Hold for 30 seconds (5 deep breaths) and repeat 2-3 times on each side once a day (extra with Left arm over)  LEFT SIDE DOWN Start on your side, make sure that your knees, your hips, and your shoulders are in a straight line. Bring your hips up off of the  Hold for ~ 30 seconds, (or until you feel like you are too tired to hold it correctly). Repeat 2-3 times (up to ~ 1:30 sec. Total).   As you get stronger, you can lengthen each hold. When you can hold for ~ 1:30 straight, try switching to on your feet instead of knees. You will need to decrease your hold time initially and build back up to the full 1:30.  (more with Left side down)  Let the top arm rest on your side, and slide along the torso as you rotate. Breathe in as you come forward, out as you open up. Do 2x15 on each side.
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
## 2020-04-21 ENCOUNTER — Ambulatory Visit: Payer: Managed Care, Other (non HMO)

## 2020-05-05 ENCOUNTER — Ambulatory Visit: Payer: Managed Care, Other (non HMO)

## 2020-05-12 ENCOUNTER — Ambulatory Visit: Payer: Managed Care, Other (non HMO) | Attending: Obstetrics and Gynecology

## 2020-05-12 ENCOUNTER — Other Ambulatory Visit: Payer: Self-pay

## 2020-05-12 DIAGNOSIS — M4125 Other idiopathic scoliosis, thoracolumbar region: Secondary | ICD-10-CM | POA: Diagnosis present

## 2020-05-12 DIAGNOSIS — M533 Sacrococcygeal disorders, not elsewhere classified: Secondary | ICD-10-CM | POA: Insufficient documentation

## 2020-05-12 DIAGNOSIS — M62838 Other muscle spasm: Secondary | ICD-10-CM
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
## 2020-05-12 NOTE — Patient Instructions (Signed)
    Start with Shoulder Retraction and Downward Rotation pictures above, holding the position while pulling the chin straight back as if trying to make a "double chin".  Breathe in forward and breathe out as you pull back, repeating this _2x15__ times __1-2__ times per day.   Exhale and focus on keeping the pelvis steady as you lead with the shoulder blades and squeeze back and down. Do 2x15, 1-2 times per day.  Do 2x15 of your "teapots" we recorded on your phone to the left only.
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
## 2020-05-26 ENCOUNTER — Ambulatory Visit: Payer: Managed Care, Other (non HMO)

## 2020-06-02 ENCOUNTER — Ambulatory Visit: Payer: Managed Care, Other (non HMO)

## 2020-06-09 ENCOUNTER — Ambulatory Visit: Payer: Managed Care, Other (non HMO) | Attending: Obstetrics and Gynecology

## 2020-06-09 ENCOUNTER — Other Ambulatory Visit: Payer: Self-pay

## 2020-06-09 DIAGNOSIS — M4125 Other idiopathic scoliosis, thoracolumbar region: Secondary | ICD-10-CM | POA: Insufficient documentation

## 2020-06-09 DIAGNOSIS — M62838 Other muscle spasm: Secondary | ICD-10-CM | POA: Diagnosis present

## 2020-06-09 DIAGNOSIS — M533 Sacrococcygeal disorders, not elsewhere classified: Secondary | ICD-10-CM | POA: Insufficient documentation

## 2020-06-09 NOTE — Patient Instructions (Signed)
Cat / Cow Flow    Exhale, press spine toward ceiling like a Halloween cat. Keeping strength in arms and abdominals, Inhale to soften spine through neutral and into cow pose. Open chest and arch back. Initiate movement between cat and cow at tailbone, one vertebrae at a time. Repeat __30__ times.  
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
## 2020-06-16 ENCOUNTER — Other Ambulatory Visit: Payer: Self-pay

## 2020-06-16 ENCOUNTER — Ambulatory Visit: Payer: Managed Care, Other (non HMO)

## 2020-06-16 DIAGNOSIS — M4125 Other idiopathic scoliosis, thoracolumbar region: Secondary | ICD-10-CM

## 2020-06-16 DIAGNOSIS — M62838 Other muscle spasm: Secondary | ICD-10-CM

## 2020-06-16 DIAGNOSIS — M533 Sacrococcygeal disorders, not elsewhere classified: Secondary | ICD-10-CM
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
## 2020-06-23 ENCOUNTER — Ambulatory Visit: Payer: Managed Care, Other (non HMO)

## 2020-06-23 DIAGNOSIS — M62838 Other muscle spasm: Secondary | ICD-10-CM

## 2020-06-23 DIAGNOSIS — M4125 Other idiopathic scoliosis, thoracolumbar region: Secondary | ICD-10-CM

## 2020-06-23 DIAGNOSIS — M533 Sacrococcygeal disorders, not elsewhere classified: Secondary | ICD-10-CM
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
## 2020-06-23 NOTE — Patient Instructions (Addendum)
Bridges    Exhale and pull the low back into the table before using the glutes to bridge your hips up off of the mat, just to the point that you can continue to keep the pelvis neutral. Inhale slightly at the top and then exhale to roll back down one spinal segment at a time, bring the upper then middle, then low back before finally letting the bottom relax down.  Do 2x10, 1 time per day.   FOCUS on Side-stretch and hip-flexor stretch for L>R.

## 2020-06-30 ENCOUNTER — Ambulatory Visit: Payer: Managed Care, Other (non HMO)

## 2020-07-07 ENCOUNTER — Ambulatory Visit: Payer: Managed Care, Other (non HMO)

## 2020-07-14 ENCOUNTER — Ambulatory Visit: Payer: Managed Care, Other (non HMO) | Attending: Obstetrics and Gynecology

## 2020-07-14 ENCOUNTER — Other Ambulatory Visit: Payer: Self-pay

## 2020-07-14 DIAGNOSIS — M62838 Other muscle spasm: Secondary | ICD-10-CM | POA: Diagnosis present

## 2020-07-14 DIAGNOSIS — M533 Sacrococcygeal disorders, not elsewhere classified: Secondary | ICD-10-CM | POA: Insufficient documentation

## 2020-07-14 DIAGNOSIS — M4125 Other idiopathic scoliosis, thoracolumbar region: Secondary | ICD-10-CM | POA: Diagnosis not present
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
## 2020-07-14 NOTE — Patient Instructions (Signed)
Kinesiotaping for posture  Look for Sensitive skin ROCKTAPE brand if you are concerned about skin sensitivity to adhesive. Start by pulling the shoulders back and down and apply the tape starting just at the front of the shoulder (there is typically a little groove) and pull with 75-100% tension back and down to the opposite side while keeping the muscles engaged. Repeat on the other side. You may wear the tape for 2-3 days and shower with it on as long as your skin tolerates it and it does not peel off.   When removing, do so slowly and use alcohol to remove any adhesive left behind. Apply a calming lotion such as Aveeno or Eucerin with no fragrance if mild irritation occurs.  

## 2020-07-21 ENCOUNTER — Other Ambulatory Visit: Payer: Self-pay

## 2020-07-21 ENCOUNTER — Ambulatory Visit: Payer: Managed Care, Other (non HMO)

## 2020-07-21 DIAGNOSIS — M4125 Other idiopathic scoliosis, thoracolumbar region: Secondary | ICD-10-CM

## 2020-07-21 DIAGNOSIS — M62838 Other muscle spasm: Secondary | ICD-10-CM

## 2020-07-21 DIAGNOSIS — M533 Sacrococcygeal disorders, not elsewhere classified: Secondary | ICD-10-CM

## 2020-07-21 NOTE — Patient Instructions (Addendum)
  [facility] Phone: [phone]  Ask if they accept your insurance.    Progressive relaxation technique:  Start by tensing all the muscles in your right foot, then calf, then upper leg, glute, low back and hold for 2 deep breaths, release the tension and let the limb melt into the table. Next start with the left leg and do the same progression, foot, lower leg, upper leg, glute, low back, then release. Then the Right hand make a fist, tense the forearm, the upper arm, shoulder, hold for two breaths, and release letting it melt into the ground, repeat on the left. Finally, start with both feet, both calves, both upper legs, both glutes, lower abdomen, upper abdomen, make fists, tighten forearms, upper arms, shoulders, face. Hold for two breaths and then completely melt into the floor.     Sit at the end of the foam roller or towel roll and lie back with your spine along the length of it. Bring your arms out to the side, palms up, and take _20_ belly breaths.  Next, slide arms up overhead doing a "snow angel" motion as you breathe in and down toward your hips as you breathe out. Do this for _10_ belly breaths.
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
## 2020-07-28 ENCOUNTER — Ambulatory Visit: Payer: Managed Care, Other (non HMO)

## 2020-09-09 ENCOUNTER — Ambulatory Visit: Payer: Managed Care, Other (non HMO) | Attending: Obstetrics and Gynecology

## 2020-09-09 ENCOUNTER — Other Ambulatory Visit: Payer: Self-pay

## 2020-09-09 DIAGNOSIS — M533 Sacrococcygeal disorders, not elsewhere classified: Secondary | ICD-10-CM

## 2020-09-09 DIAGNOSIS — M4125 Other idiopathic scoliosis, thoracolumbar region: Secondary | ICD-10-CM

## 2020-09-09 DIAGNOSIS — M62838 Other muscle spasm: Secondary | ICD-10-CM | POA: Diagnosis present
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
## 2020-09-16 ENCOUNTER — Ambulatory Visit: Payer: Managed Care, Other (non HMO)

## 2020-09-16 ENCOUNTER — Other Ambulatory Visit: Payer: Self-pay

## 2020-09-16 DIAGNOSIS — M4125 Other idiopathic scoliosis, thoracolumbar region: Secondary | ICD-10-CM | POA: Diagnosis not present

## 2020-09-16 DIAGNOSIS — M533 Sacrococcygeal disorders, not elsewhere classified: Secondary | ICD-10-CM

## 2020-09-16 DIAGNOSIS — M62838 Other muscle spasm: Secondary | ICD-10-CM
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
## 2020-09-16 NOTE — Patient Instructions (Signed)
Hip Abduction: Side Leg Lift - Side-Lying    Lie on side. Draw lower tummy muscle (TA) and pelvic floor in, Lift top leg until you feel strong contraction of muscle on the side of the hip. Keep top leg straight with body, toes pointing forward. Do not let your hip roll back! Do _10__ reps per set, __3_ sets per day   

## 2020-09-23 ENCOUNTER — Ambulatory Visit: Payer: Managed Care, Other (non HMO)

## 2020-09-30 ENCOUNTER — Ambulatory Visit: Payer: Managed Care, Other (non HMO)

## 2020-10-07 ENCOUNTER — Ambulatory Visit: Payer: Managed Care, Other (non HMO)

## 2020-10-14 ENCOUNTER — Ambulatory Visit: Payer: Managed Care, Other (non HMO)

## 2020-10-21 ENCOUNTER — Ambulatory Visit: Payer: Managed Care, Other (non HMO)

## 2020-10-28 ENCOUNTER — Ambulatory Visit: Payer: Managed Care, Other (non HMO)

## 2020-11-04 ENCOUNTER — Encounter: Payer: Managed Care, Other (non HMO) | Admitting: Physical Therapy

## 2020-11-25 ENCOUNTER — Encounter: Payer: Managed Care, Other (non HMO) | Admitting: Physical Therapy

## 2020-12-28 ENCOUNTER — Ambulatory Visit (INDEPENDENT_AMBULATORY_CARE_PROVIDER_SITE_OTHER): Payer: No Typology Code available for payment source | Admitting: Nurse Practitioner

## 2020-12-28 ENCOUNTER — Encounter: Payer: Self-pay | Admitting: Nurse Practitioner

## 2020-12-28 ENCOUNTER — Other Ambulatory Visit: Payer: Self-pay

## 2020-12-28 VITALS — BP 118/72 | HR 82 | Temp 98.1°F | Resp 16 | Ht 63.0 in | Wt 145.5 lb

## 2020-12-28 DIAGNOSIS — F331 Major depressive disorder, recurrent, moderate: Secondary | ICD-10-CM | POA: Diagnosis not present

## 2020-12-28 DIAGNOSIS — M25532 Pain in left wrist: Secondary | ICD-10-CM | POA: Diagnosis not present

## 2020-12-28 DIAGNOSIS — Z7689 Persons encountering health services in other specified circumstances: Secondary | ICD-10-CM | POA: Diagnosis not present
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
## 2020-12-28 NOTE — Patient Instructions (Signed)
"  988" is the new national hotline for behavioral health emergencies. Please reach out through My Chart or call the office if any questions or concerns. Follow up 1-2 months for a CPE
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
## 2020-12-28 NOTE — Assessment & Plan Note (Addendum)
History of the same.  States she has been on different SSRIs in the past and they intensify her feelings and thoughts for SI.  We discussed treatment options in office and mutually agreed for the best care that she be referred to psychiatry group.  Did discuss that if she starts developing SI/HI she either needs to contact the office or gave information for the new national hotline for behavioral health "988".  Patient agreed and acknowledged the plan. Did discuss getting some screening labs today to make sure metabolic increasing her depressive symptoms.  She stated she did not have time today and she had to miss some work for today's appointment.  Discussed getting these labs along with others and her follow-up CPE.

## 2020-12-28 NOTE — Assessment & Plan Note (Signed)
Transfer of care from  former provider in office.  Electronic medical records reviewed.

## 2021-05-22 ENCOUNTER — Encounter (HOSPITAL_COMMUNITY): Payer: Self-pay | Admitting: Emergency Medicine

## 2021-05-22 ENCOUNTER — Emergency Department (HOSPITAL_COMMUNITY): Payer: No Typology Code available for payment source

## 2021-05-22 ENCOUNTER — Emergency Department (HOSPITAL_COMMUNITY)
Admission: EM | Admit: 2021-05-22 | Discharge: 2021-05-22 | Disposition: A | Discharge status: Home / Self Care | Payer: No Typology Code available for payment source | Attending: Emergency Medicine | Admitting: Emergency Medicine

## 2021-05-22 DIAGNOSIS — G43809 Other migraine, not intractable, without status migrainosus: Secondary | ICD-10-CM | POA: Diagnosis not present

## 2021-05-22 DIAGNOSIS — M542 Cervicalgia: Secondary | ICD-10-CM | POA: Diagnosis not present

## 2021-05-22 DIAGNOSIS — Y92410 Unspecified street and highway as the place of occurrence of the external cause: Secondary | ICD-10-CM | POA: Diagnosis not present

## 2021-05-22 DIAGNOSIS — N189 Chronic kidney disease, unspecified: Secondary | ICD-10-CM | POA: Diagnosis not present

## 2021-05-22 MED ORDER — METHOCARBAMOL 500 MG PO TABS: 500.0000 mg | ORAL_TABLET | Freq: Two times a day (BID) | ORAL | 0 refills | Status: DC

## 2021-05-22 MED ORDER — SUMATRIPTAN SUCCINATE 100 MG PO TABS: 100.0000 mg | ORAL_TABLET | ORAL | 0 refills | Status: DC | PRN
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
## 2021-05-22 NOTE — Discharge Instructions (Addendum)
Please use Tylenol or ibuprofen for pain.  You may use 600 mg ibuprofen every 6 hours or 1000 mg of Tylenol every 6 hours.  You may choose to alternate between the 2.  This would be most effective.  Not to exceed 4 g of Tylenol within 24 hours.  Not to exceed 3200 mg ibuprofen 24 hours.  You can use the imitrex as prescribed in addition to the above to help with migraine symptoms. Max 2 doses / day.  You can use the muscle relaxant in addition for breakthrough muscle pain -- please limit to twice daily. It may make you sleepy so please use caution prior to operating a motor vehicle or operating heavy machinery.  Additionally I recommend gentle stretching of the neck, back as tolerated to help to loosen tight muscles.
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
## 2022-11-20 ENCOUNTER — Ambulatory Visit (INDEPENDENT_AMBULATORY_CARE_PROVIDER_SITE_OTHER): Payer: Commercial Managed Care - PPO | Admitting: Nurse Practitioner

## 2022-11-20 ENCOUNTER — Encounter: Payer: Self-pay | Admitting: Nurse Practitioner

## 2022-11-20 VITALS — BP 108/68 | HR 76 | Temp 98.3°F | Resp 16 | Ht 63.5 in | Wt 160.1 lb

## 2022-11-20 DIAGNOSIS — Z23 Encounter for immunization: Secondary | ICD-10-CM | POA: Diagnosis not present

## 2022-11-20 DIAGNOSIS — E7800 Pure hypercholesterolemia, unspecified: Secondary | ICD-10-CM

## 2022-11-20 DIAGNOSIS — Z0000 Encounter for general adult medical examination without abnormal findings: Secondary | ICD-10-CM | POA: Diagnosis not present

## 2022-11-20 DIAGNOSIS — Z111 Encounter for screening for respiratory tuberculosis: Secondary | ICD-10-CM

## 2022-11-20 DIAGNOSIS — M357 Hypermobility syndrome: Secondary | ICD-10-CM

## 2022-11-20 DIAGNOSIS — R7989 Other specified abnormal findings of blood chemistry: Secondary | ICD-10-CM

## 2022-11-20 DIAGNOSIS — F331 Major depressive disorder, recurrent, moderate: Secondary | ICD-10-CM | POA: Diagnosis not present

## 2022-11-20 LAB — COMPREHENSIVE METABOLIC PANEL
ALT: 18 U/L (ref 0–35)
AST: 19 U/L (ref 0–37)
Albumin: 3.9 g/dL (ref 3.5–5.2)
Alkaline Phosphatase: 52 U/L (ref 39–117)
BUN: 11 mg/dL (ref 6–23)
CO2: 24 mEq/L (ref 19–32)
Calcium: 8.9 mg/dL (ref 8.4–10.5)
Chloride: 103 mEq/L (ref 96–112)
Creatinine, Ser: 0.63 mg/dL (ref 0.40–1.20)
GFR: 123.36 mL/min (ref >60.00)
Glucose, Bld: 86 mg/dL (ref 70–99)
Potassium: 3.8 mEq/L (ref 3.5–5.1)
Sodium: 136 mEq/L (ref 135–145)
Total Bilirubin: 0.4 mg/dL (ref 0.2–1.2)
Total Protein: 7.2 g/dL (ref 6.0–8.3)

## 2022-11-20 LAB — CBC
HCT: 42.1 % (ref 36.0–46.0)
Hemoglobin: 14 g/dL (ref 12.0–15.0)
MCHC: 33.3 g/dL (ref 30.0–36.0)
MCV: 93.1 fl (ref 78.0–100.0)
Platelets: 300 10*3/uL (ref 150.0–400.0)
RBC: 4.52 Mil/uL (ref 3.87–5.11)
RDW: 13.2 % (ref 11.5–15.5)
WBC: 9 10*3/uL (ref 4.0–10.5)

## 2022-11-20 LAB — TSH: TSH: 6.41 u[IU]/mL — ABNORMAL HIGH (ref 0.35–5.50)

## 2022-11-20 LAB — LIPID PANEL
Cholesterol: 203 mg/dL — ABNORMAL HIGH (ref 0–200)
HDL: 56.9 mg/dL (ref >39.00)
NonHDL: 145.91
Total CHOL/HDL Ratio: 4
Triglycerides: 205 mg/dL — ABNORMAL HIGH (ref 0.0–149.0)
VLDL: 41 mg/dL — ABNORMAL HIGH (ref 0.0–40.0)

## 2022-11-20 LAB — LDL CHOLESTEROL, DIRECT: Direct LDL: 128 mg/dL

## 2022-11-20 NOTE — Assessment & Plan Note (Signed)
Patient states elevated lisinopril not currently followed or being managed by rheumatologist.

## 2022-11-20 NOTE — Assessment & Plan Note (Signed)
Patient has been evaluated in the past was sent to psychiatry at [facility] medical states she did take the medication for a period of time and then came off.  Has been off approximately year stable per patient report she denies HI/SI/AVH.

## 2022-11-20 NOTE — Patient Instructions (Signed)
Nice to see you today I will be in touch with the labs and once I have completed the paperwork Follow up with me in 1 year, sooner if you need me   We did update your tetanus vaccine today

## 2022-11-20 NOTE — Assessment & Plan Note (Signed)
Elevated LDL last check.  Patient is been doing Mediterranean diet repeat lipid panel today

## 2022-11-20 NOTE — Progress Notes (Signed)
Established Patient Office Visit  Subjective   Patient ID: Taylor Miranda, female    DOB: [birth year]  Age: 27 y.o. MRN: [id number]  Chief Complaint  Patient presents with   Annual Exam    HPI  for complete physical and follow up of chronic conditions.  HQI:ONGEXB that she was evaluated and took the medication for a while. States that she felt better and then  has been off for a year. 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 Not currently being followed or treated by rheumatology   Immunizations: -Tetanus: Completed in 2010 -Influenza: out of season  -Shingles: too young -Pneumonia: too young   -HPV: utd   Diet: Fair diet. States that she eats 2ish meals a day. States that she did switch to [employer]. Water and herbal teas   Exercise: No regular exercise. Walk a lot for work   Eye exam:  PRN. Has glasses.  Dental exam: Needs updating    Colonoscopy: too young  Lung Cancer Screening: N/A  Pap smear: Christeen Douglas. Due this year   Mammogram: too young  Dexa: too young   Sleep: state that she goes to bed 1-3am and she will toss and turn. States she get up at 6something and then will go back to bed 10-12p. Does not feel rested. Does snore         Review of Systems  Constitutional:  Negative for chills and fever.  Respiratory:  Negative for shortness of breath.   Cardiovascular:  Negative for chest pain and leg swelling.  Gastrointestinal:  Positive for nausea. Negative for abdominal pain, blood in stool, constipation, diarrhea and vomiting.       BM every other day   Genitourinary:  Negative for dysuria and hematuria.       Period regular   Neurological:  Negative for tingling and headaches.  Psychiatric/Behavioral:  Negative for hallucinations and suicidal ideas.       Objective:     BP 108/68   Pulse 76   Temp 98.3 F (36.8 C)   Resp 16   Ht 5' 3.5" (1.613 m)   Wt 160 lb 2 oz  (72.6 kg)   LMP 11/01/2022 (Approximate)   SpO2 97%   BMI 27.92 kg/m    Physical Exam Vitals and nursing note reviewed.  Constitutional:      Appearance: Normal appearance.  HENT:     Right Ear: Tympanic membrane, ear canal and external ear normal.     Left Ear: Tympanic membrane, ear canal and external ear normal.     Mouth/Throat:     Mouth: Mucous membranes are moist.     Pharynx: Oropharynx is clear.  Eyes:     Extraocular Movements: Extraocular movements intact.     Pupils: Pupils are equal, round, and reactive to light.  Cardiovascular:     Rate and Rhythm: Normal rate and regular rhythm.     Pulses: Normal pulses.     Heart sounds: Normal heart sounds.  Pulmonary:     Effort: Pulmonary effort is normal.     Breath sounds: Normal breath sounds.  Abdominal:     General: Bowel sounds are normal. There is no distension.     Palpations: There is no mass.     Tenderness: There is abdominal tenderness.     Hernia: No hernia is present.    Musculoskeletal:     Right lower leg: No edema.  Left lower leg: No edema.  Lymphadenopathy:     Cervical: No cervical adenopathy.  Skin:    General: Skin is warm.  Neurological:     General: No focal deficit present.     Mental Status: She is alert.     Deep Tendon Reflexes:     Reflex Scores:      Bicep reflexes are 2+ on the right side and 2+ on the left side.      Patellar reflexes are 2+ on the right side and 2+ on the left side.    Comments: Bilateral upper and lower extremity strength 5/5  Psychiatric:        Mood and Affect: Mood normal.        Behavior: Behavior normal.        Thought Content: Thought content normal.        Judgment: Judgment normal.      No results found for any visits on 11/20/22.    The ASCVD Risk score (Arnett DK, et al., 2019) failed to calculate for the following reasons:   The 2019 ASCVD risk score is only valid for ages 53 to 52    Assessment & Plan:   Problem List Items  Addressed This Visit       Musculoskeletal and Integument   Hypermobility syndrome    Patient states elevated lisinopril not currently followed or being managed by rheumatologist.        Other   Moderate recurrent major depression (HCC)    Patient has been evaluated in the past was sent to psychiatry at [facility] medical states she did take the medication for a period of time and then came off.  Has been off approximately year stable per patient report she denies HI/SI/AVH.      Preventative health care - Primary    Discussed age-appropriate immunizations and screening exams.  Did review patient's personal, surgical, social, family histories.  Patient is up-to-date on all his vaccinations.  We did update tetanus vaccine today.  Patient is too young for CRC screening.  Patient is up-to-date on cervical cancer screening is followed by GYN.  Patient was given information at discharge about preventative healthcare maintenance with anticipatory guidance.  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  Patient is been doing Mediterranean diet repeat lipid panel today      Relevant Orders   Lipid panel   Other Visit Diagnoses     Screening for tuberculosis       Relevant Orders   QuantiFERON-TB Gold Plus   Need for tetanus booster       Relevant Orders   Td : Tetanus/diphtheria >7yo Preservative  free (Completed)       Return in about 1 year (around 11/20/2023) for CPE and Labs.    Audria Nine, NP

## 2022-11-20 NOTE — Assessment & Plan Note (Addendum)
Discussed age-appropriate immunizations and screening exams.  Did review patient's personal, surgical, social, family histories.  Patient is up-to-date on all his vaccinations.  We did update tetanus vaccine today.  Patient is too young for CRC screening.  Patient is up-to-date on cervical cancer screening is followed by GYN.  Patient was given information at discharge about preventative healthcare maintenance with anticipatory guidance.  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

## 2022-11-22 ENCOUNTER — Ambulatory Visit (INDEPENDENT_AMBULATORY_CARE_PROVIDER_SITE_OTHER): Payer: Commercial Managed Care - PPO

## 2022-11-22 DIAGNOSIS — R7989 Other specified abnormal findings of blood chemistry: Secondary | ICD-10-CM

## 2022-11-22 LAB — QUANTIFERON-TB GOLD PLUS
Mitogen-NIL: 8.58 IU/mL
NIL: 0.09 IU/mL
QuantiFERON-TB Gold Plus: NEGATIVE
TB1-NIL: 0.01 IU/mL
TB2-NIL: <0 IU/mL

## 2022-11-22 LAB — T3, FREE: T3, Free: 2.9 pg/mL (ref 2.3–4.2)

## 2022-11-22 LAB — T4, FREE: Free T4: 0.83 ng/dL (ref 0.60–1.60)

## 2022-11-22 NOTE — Addendum Note (Signed)
Addended by: Shon Millet on: 11/22/2022 09:35 AM   Modules accepted: Orders

## 2022-11-23 ENCOUNTER — Other Ambulatory Visit: Payer: Self-pay | Admitting: Nurse Practitioner

## 2022-11-23 ENCOUNTER — Encounter: Payer: Self-pay | Admitting: Nurse Practitioner

## 2022-11-28 ENCOUNTER — Encounter: Payer: Self-pay | Admitting: Nurse Practitioner

## 2022-11-28 ENCOUNTER — Telehealth: Payer: Self-pay | Admitting: Nurse Practitioner

## 2022-11-28 ENCOUNTER — Telehealth: Payer: Self-pay

## 2022-11-28 NOTE — Telephone Encounter (Signed)
Received call from patient she was at the school. They are refusing to take just the TD. They state that patient is required to get a TDAP with in 10 years. Patient would like to know when she can get TDAP in order to continue with clinicals. Advised that I will check with provider and reach out once I have received his direction.

## 2022-11-28 NOTE — Telephone Encounter (Signed)
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 She had a TDAP at age 27. Per the CDC guidelines she does not need another TDAP. The school is refusing to take the TD even though it is CDC guidelines. I gave her a TD on 11/20/2022. When will it be safe to do a TDAP or can she get the pertussis piece separate ?

## 2022-11-28 NOTE — Telephone Encounter (Signed)
Per CDC guidelines she does not need another TDAP. I have reached out to our pharmacist to see if we can revaccinate

## 2022-11-28 NOTE — Telephone Encounter (Signed)
Matt - I'm unable to find any recommendations about a minimum time between vaccinations in this situation. I would advise that you can go ahead and give Tdap, but she may be more likely to have Td- related side effects - injection site reaction, headache. Pre treatment with acetaminophen or ibuprofen would be ok.   Thanks!

## 2022-11-29 NOTE — Telephone Encounter (Signed)
Pt schedule for TDAP 12/04/22.  Is aware of recommendations of pre and post medications.

## 2022-11-29 NOTE — Telephone Encounter (Signed)
See below about the recommendations from our pharmacist  She can be scheduled for the TDAP but will have a higher chance of having a site reaction and headache. If she would like to take 1,000mg  of tylenol prior to the injection and then she can take 400mg  of ibuprofen 4 hours after the injection to help minimize the effects

## 2022-11-29 NOTE — Telephone Encounter (Signed)
Pt is scheduled for TDAP vaccination on 12/04/22.  Is there a form we need to fill out, if so please let me know where it is.

## 2022-12-04 ENCOUNTER — Ambulatory Visit (INDEPENDENT_AMBULATORY_CARE_PROVIDER_SITE_OTHER): Payer: Commercial Managed Care - PPO

## 2022-12-04 DIAGNOSIS — Z23 Encounter for immunization: Secondary | ICD-10-CM | POA: Diagnosis not present

## 2022-12-04 NOTE — Progress Notes (Signed)
Per orders of Mordecai Maes, NP ,who is out of office and Allayne Gitelman NP who is in office injection of tdap given by Lewanda Rife in right thigh.(Pts request) Patient tolerated injection well.

## 2023-02-21 ENCOUNTER — Ambulatory Visit (INDEPENDENT_AMBULATORY_CARE_PROVIDER_SITE_OTHER): Payer: Commercial Managed Care - PPO | Admitting: *Deleted

## 2023-02-21 DIAGNOSIS — Z23 Encounter for immunization: Secondary | ICD-10-CM | POA: Diagnosis not present

## 2023-09-19 ENCOUNTER — Encounter: Payer: Self-pay | Admitting: Nurse Practitioner

## 2023-11-26 ENCOUNTER — Ambulatory Visit (INDEPENDENT_AMBULATORY_CARE_PROVIDER_SITE_OTHER): Admitting: Nurse Practitioner

## 2023-11-26 VITALS — BP 108/78 | HR 67 | Temp 97.9°F | Ht 63.5 in | Wt 136.8 lb

## 2023-11-26 DIAGNOSIS — M278 Other specified diseases of jaws: Secondary | ICD-10-CM | POA: Insufficient documentation

## 2023-11-26 LAB — CBC WITH DIFFERENTIAL/PLATELET
Basophils Absolute: 0 10*3/uL (ref 0.0–0.1)
Basophils Relative: 0.9 % (ref 0.0–3.0)
Eosinophils Absolute: 0.2 10*3/uL (ref 0.0–0.7)
Eosinophils Relative: 3.6 % (ref 0.0–5.0)
HCT: 42.6 % (ref 36.0–46.0)
Hemoglobin: 14.4 g/dL (ref 12.0–15.0)
Lymphocytes Relative: 54.9 % — ABNORMAL HIGH (ref 12.0–46.0)
Lymphs Abs: 2.7 10*3/uL (ref 0.7–4.0)
MCHC: 33.8 g/dL (ref 30.0–36.0)
MCV: 92 fl (ref 78.0–100.0)
Monocytes Absolute: 0.4 10*3/uL (ref 0.1–1.0)
Monocytes Relative: 7.4 % (ref 3.0–12.0)
Neutro Abs: 1.6 10*3/uL (ref 1.4–7.7)
Neutrophils Relative %: 33.2 % — ABNORMAL LOW (ref 43.0–77.0)
Platelets: 258 10*3/uL (ref 150.0–400.0)
RBC: 4.63 Mil/uL (ref 3.87–5.11)
RDW: 13.6 % (ref 11.5–15.5)
WBC: 4.9 10*3/uL (ref 4.0–10.5)

## 2023-11-26 NOTE — Assessment & Plan Note (Signed)
 Will obtain US  of head/neck. If unable to visualize we can get CT of neck soft tissue. It is non mobile and abuts the mandible. Pending cbc with diff today

## 2023-11-26 NOTE — Progress Notes (Signed)
   Acute Office Visit  Subjective:     Patient ID: Taylor Miranda, female    DOB: 1995-10-29, 28 y.o.   MRN: [id number]  Chief Complaint  Patient presents with   Lump on Jaw    Pt complains of lump behind her R jaw. States of gradually getting bigger. Pt noticed about about 1-2 months ago. Lump is not painful. Lump is hard.     HPI Patient is in today for  Mass with a history of Hypermobility syndrome, HLD, epigastric hernia  Statse that she first noticed it approx 2 weeks before she messaged me on 09/19/2023. States that it does not hurt but it has gotten bigger. States that it has not been a great increase but has been an increase.  States that she did rub across and feel it. States that she was cleaning behind her ears and then noticed it. She checked the other side and it was not there  States that if she chews too muc on that side it is uncomfortable   States that she has tried aleve for her normal aches that has not helped with the mass behind her jaw.  She has not tied warm/cold compresses  Review of Systems  Constitutional:  Negative for chills and fever.  HENT:  Negative for congestion, ear discharge, ear pain, sinus pain and sore throat.   Respiratory:  Negative for shortness of breath.   Cardiovascular:  Negative for chest pain.  Skin:  Negative for itching and rash.  Neurological:  Negative for dizziness and headaches.        Objective:    BP 108/78   Pulse 67   Temp 97.9 F (36.6 C) (Oral)   Ht 5' 3.5 (1.613 m)   Wt 136 lb 12.8 oz (62.1 kg)   SpO2 99%   BMI 23.85 kg/m    Physical Exam Vitals and nursing note reviewed.  Constitutional:      Appearance: Normal appearance.  HENT:     Head:      Mouth/Throat:     Mouth: Mucous membranes are moist.     Pharynx: Oropharynx is clear.   Cardiovascular:     Rate and Rhythm: Normal rate and regular rhythm.     Heart sounds: Normal heart sounds.  Pulmonary:     Effort: Pulmonary effort is normal.      Breath sounds: Normal breath sounds.  Lymphadenopathy:     Cervical: No cervical adenopathy.   Neurological:     Mental Status: She is alert.     No results found for any visits on 11/26/23.      Assessment & Plan:   Problem List Items Addressed This Visit       Other   Mass of jaw - Primary   Will obtain US  of head/neck. If unable to visualize we can get CT of neck soft tissue. It is non mobile and abuts the mandible. Pending cbc with diff today       Relevant Orders   US  SOFT TISSUE HEAD & NECK (NON-THYROID )   CBC with Differential/Platelet    No orders of the defined types were placed in this encounter.   Return in about 3 months (around 02/26/2024) for CPE and Labs.  Adina Crandall, NP

## 2023-11-26 NOTE — Patient Instructions (Signed)
 Nice to see you today  Call and schedule Ultrasound at   [facility] - off [address] [place], [place] , [place] [address] [phone] Open 8am-5pm   Make a physical appointment with me over the next 2-3 months

## 2023-11-28 ENCOUNTER — Ambulatory Visit: Payer: Self-pay | Admitting: Nurse Practitioner

## 2023-12-02 ENCOUNTER — Ambulatory Visit
Admission: RE | Admit: 2023-12-02 | Discharge: 2023-12-02 | Disposition: A | Discharge status: Home / Self Care | Source: Ambulatory Visit | Attending: Nurse Practitioner | Admitting: Nurse Practitioner

## 2023-12-02 DIAGNOSIS — M278 Other specified diseases of jaws: Secondary | ICD-10-CM | POA: Diagnosis present

## 2024-06-01 ENCOUNTER — Ambulatory Visit: Payer: Self-pay
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
## 2024-06-01 NOTE — Telephone Encounter (Signed)
Noted. Agree with being evaluated

## 2024-06-03 ENCOUNTER — Ambulatory Visit: Payer: Self-pay

## 2024-06-03 NOTE — Telephone Encounter (Signed)
 FYI Only or Action Required?: Action required by provider: Refusing hospital, needs call back asap.  Patient was last seen in primary care on 11/26/2023 by Wendee Lynwood HERO, NP.  Called Nurse Triage reporting Fall.  Symptoms began several months ago.  Interventions attempted: Nothing.  Symptoms are: rapidly worsening.  Triage Disposition: Go to ED Now (Notify PCP)  Patient/caregiver understands and will follow disposition?: No, refuses disposition    Copied from CRM [id number]. Topic: Clinical - Red Word Triage >> Jun 03, 2024  3:05 PM Taylor Miranda wrote: Red Word that prompted transfer to Nurse Triage: patient is calling about fall that she had requesting to speak with nt again states she can not go to the er Reason for Disposition  Injury (or injuries) that need emergency care  Answer Assessment - Initial Assessment Questions This RN advised strongly that pt be examined in ED asap and to call 911 if any worsening. Pt still refusing, requesting other options from provider as far as outpatient imaging. Advised pt at least go to UC if refusing ED but strongly urged pt go to ED as most appropriate for pt symptoms. Pt requesting call back from PCP office for further recommendations. Sending message to PCP office.   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, not able to go at the moment and leave her for that long, can I just go through you guys, get orders for x-ray and CT, or have to see me first Not trying to be difficult, hard for me to get there right now May be able to do UC but would have to find one that wouldn't take long time Can inquire about orders Biggest worry is that I've waited so long, don't want to waste anyone's time Nothing worsened Hurting right now 7/10 (increased from 4-5/10 on 12/29) Dizziness not worse or more frequent, normal for me, not been acting up  today Numbness is the same, mainly when driving or sitting for extended period of time Um wouldn't say weaker, but I'm shaking, like I'm watching my hands shake right now, my coworker said I'm limping but I just think that's cuz I'm hurting Strongly advised ED Was hoping for a different option Sending message to PCP for call back with further recommendations  Protocols used: Falls and [facility]

## 2024-06-05 NOTE — Telephone Encounter (Signed)
 I spoke with pt; pt said fell about 2 months ago and this is ongoing pain in LS spine area. Right now pain level 3. 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 Pt said some one would be available next wk to watch the pt Brynnleigh takes care of. UC & ED precautions given and  pt voiced understanding but still wants appt next wk. Pt scheduled appt with Adina Crandall NP on 01/*05/26 at 9:40 wiih UC & ED precautions again and  pt voiced understanding. Sending note to CHRISTELLA Crandall NP.

## 2024-06-05 NOTE — Telephone Encounter (Signed)
 Noted. Will evaluate in office

## 2024-06-08 ENCOUNTER — Ambulatory Visit (INDEPENDENT_AMBULATORY_CARE_PROVIDER_SITE_OTHER)
Admission: RE | Admit: 2024-06-08 | Discharge: 2024-06-08 | Disposition: A | Discharge status: Home / Self Care | Source: Ambulatory Visit | Attending: Nurse Practitioner | Admitting: Nurse Practitioner

## 2024-06-08 ENCOUNTER — Ambulatory Visit: Admitting: Nurse Practitioner

## 2024-06-08 VITALS — BP 100/60 | HR 88 | Temp 98.0°F | Ht 63.5 in | Wt 145.8 lb

## 2024-06-08 DIAGNOSIS — G44209 Tension-type headache, unspecified, not intractable: Secondary | ICD-10-CM

## 2024-06-08 DIAGNOSIS — M546 Pain in thoracic spine: Secondary | ICD-10-CM

## 2024-06-08 DIAGNOSIS — M545 Low back pain, unspecified: Secondary | ICD-10-CM

## 2024-06-08 DIAGNOSIS — W19XXXA Unspecified fall, initial encounter: Secondary | ICD-10-CM | POA: Diagnosis not present

## 2024-06-08 LAB — POCT URINE PREGNANCY: Preg Test, Ur: NEGATIVE

## 2024-06-08 MED ORDER — CYCLOBENZAPRINE HCL 5 MG PO TABS: 5.0000 mg | ORAL_TABLET | Freq: Two times a day (BID) | ORAL | 0 refills | Status: AC | PRN

## 2024-06-08 NOTE — Progress Notes (Signed)
 "  Established Patient Office Visit  Subjective   Patient ID: Taylor Miranda, female    DOB: 05-23-1996  Age: 29 y.o. MRN: [id number]  Chief Complaint  Patient presents with   Fall    Pt complains of pain from a fall from a month ago. Pt complains of pain in lumbar spine midway thru thoracic spine. Pt complains of headaches and states when driving left leg goes numb. Pain level in back is 5        With a history of hypermobility syndrome, MDD, HLD, elevated CRP  Discussed the use of AI scribe software for clinical note transcription with the patient, who gave verbal consent to proceed.  History of Present Illness Taylor Miranda is a 29 year old female who presents with worsening lower back pain and frequent headaches following a fall.  Approximately one and a half to two months ago, she experienced a fall at home while wearing fuzzy socks. She became dizzy while attempting to open a difficult back door, missed the handle, and fell onto a hard wood floor, landing on her bottom. Initially, the pain was present but manageable. However, in the past few weeks, the pain has intensified without any new injury. The pain is primarily located in the lower back, and she reports numbness in her left leg while driving, which resolves upon movement. No weakness in the legs, numbness or tingling in the saddle area, or issues with bowel or bladder control.  For the back pain, she has tried over-the-counter medications including Aleve, ibuprofen , Advil , and Tylenol , with limited relief. She mentions taking up to eight ibuprofen  tablets at a time, which provides some temporary relief but is not ideal. She has also used topical treatments like Icy Hot patches and [employer]ex, which help with her joint issues and fibromobility.  She reports experiencing headaches since the day of the fall, describing them as a band-like squeezing sensation around her head. These headaches occur approximately every other day and  last all day. Over-the-counter medications for her back pain have not alleviated the headaches, but she finds some relief with Excedrin, taking one tablet as needed.  Her current medications include birth control and Aleve.   Suppose to start this week   Review of Systems  Constitutional:  Negative for chills and fever.  Respiratory:  Negative for shortness of breath.   Cardiovascular:  Negative for chest pain.  Musculoskeletal:  Positive for back pain and falls.  Neurological:  Positive for tingling and headaches.      Objective:     BP 100/60   Pulse 88   Temp 98 F (36.7 C) (Oral)   Ht 5' 3.5 (1.613 m)   Wt 145 lb 12.8 oz (66.1 kg)   SpO2 99%   BMI 25.42 kg/m  BP Readings from Last 3 Encounters:  06/08/24 100/60  11/26/23 108/78  11/20/22 108/68   Wt Readings from Last 3 Encounters:  06/08/24 145 lb 12.8 oz (66.1 kg)  11/26/23 136 lb 12.8 oz (62.1 kg)  11/20/22 160 lb 2 oz (72.6 kg)   SpO2 Readings from Last 3 Encounters:  06/08/24 99%  11/26/23 99%  11/20/22 97%      Physical Exam Vitals and nursing note reviewed.  Constitutional:      Appearance: Normal appearance.  HENT:     Right Ear: Tympanic membrane, ear canal and external ear normal.     Left Ear: Tympanic membrane, ear canal and external ear normal.     Mouth/Throat:  Mouth: Mucous membranes are moist.     Pharynx: Oropharynx is clear.  Eyes:     Extraocular Movements: Extraocular movements intact.     Pupils: Pupils are equal, round, and reactive to light.  Cardiovascular:     Rate and Rhythm: Normal rate and regular rhythm.     Heart sounds: Normal heart sounds.  Pulmonary:     Effort: Pulmonary effort is normal.     Breath sounds: Normal breath sounds.  Musculoskeletal:     Cervical back: Tenderness present.     Thoracic back: Bony tenderness present.     Lumbar back: Tenderness and bony tenderness present. Positive right straight leg raise test and positive left straight leg  raise test.     Right lower leg: No edema.     Left lower leg: No edema.  Neurological:     Mental Status: She is alert.     Deep Tendon Reflexes:     Reflex Scores:      Patellar reflexes are 2+ on the right side and 2+ on the left side.    Comments: RLE strength 5/5 LLE 4/5 break through weakness       Results for orders placed or performed in visit on 06/08/24  POCT urine pregnancy  Result Value Ref Range   Preg Test, Ur Negative Negative      The ASCVD Risk score (Arnett DK, et al., 2019) failed to calculate for the following reasons:   The 2019 ASCVD risk score is only valid for ages 36 to 78   * - Cholesterol units were assumed    Assessment & Plan:   Problem List Items Addressed This Visit   None Visit Diagnoses       Fall, initial encounter    -  Primary   Relevant Orders   DG Lumbar Spine Complete     Acute midline thoracic back pain       Relevant Medications   cyclobenzaprine  (FLEXERIL ) 5 MG tablet   Other Relevant Orders   DG Thoracic Spine W/Swimmers     Lumbar pain       Relevant Medications   cyclobenzaprine  (FLEXERIL ) 5 MG tablet   Other Relevant Orders   POCT urine pregnancy (Completed)   DG Lumbar Spine Complete     Tension headache       Relevant Medications   cyclobenzaprine  (FLEXERIL ) 5 MG tablet      Assessment and Plan Assessment & Plan Low back pain with left leg paresthesia and possible weakness Chronic low back pain with recent exacerbation, associated with left leg paresthesia and possible weakness. Differential includes muscular strain versus nerve impingement. Mild weakness in the left leg, possibly due to pain or nerve impingement. - Ordered x-ray of thoracic and lumbar spine. - Prescribed muscle relaxer for neck and back pain. - Advised to seek immediate care if leg weakness worsens, or if there is bowel or bladder incontinence, or saddle anesthesia. - Discussed potential need for physical therapy or advanced imaging (MRI)  based on x-ray results.  Tension-type headache Frequent tension-type headaches likely muscular in origin, possibly related to muscle tension. Excedrin provides some relief. - Prescribed muscle relaxer to alleviate muscle tension contributing to headaches. - Advised on the use of Excedrin for headache relief as needed.   Return if symptoms worsen or fail to improve.    Adina Crandall, NP  "

## 2024-06-17 ENCOUNTER — Ambulatory Visit: Payer: Self-pay | Admitting: Nurse Practitioner

## 2024-06-17 DIAGNOSIS — M546 Pain in thoracic spine: Secondary | ICD-10-CM

## 2024-06-17 NOTE — Addendum Note (Signed)
 Addended by: WENDEE LYNWOOD HERO on: 06/17/2024 04:24 PM   Modules accepted: Orders
